# Patient Record
Sex: Female | Born: 1975 | Race: Black or African American | Hispanic: No | Marital: Single | Smoking: Never smoker
Health system: Southern US, Community
[De-identification: ages and names within clinical notes are randomized; demographics above are authoritative.]

## PROBLEM LIST (undated history)

## (undated) DIAGNOSIS — D509 Iron deficiency anemia, unspecified: Secondary | ICD-10-CM

## (undated) DIAGNOSIS — Z9889 Other specified postprocedural states: Secondary | ICD-10-CM

## (undated) DIAGNOSIS — R112 Nausea with vomiting, unspecified: Secondary | ICD-10-CM

## (undated) DIAGNOSIS — I10 Essential (primary) hypertension: Secondary | ICD-10-CM

## (undated) DIAGNOSIS — K429 Umbilical hernia without obstruction or gangrene: Secondary | ICD-10-CM

## (undated) HISTORY — PX: PARTIAL HYSTERECTOMY: SHX80

## (undated) HISTORY — PX: MULTIPLE TOOTH EXTRACTIONS: SHX2053

---

## 2006-12-02 ENCOUNTER — Emergency Department (HOSPITAL_COMMUNITY): Admission: EM | Admit: 2006-12-02 | Discharge: 2006-12-02 | Payer: Self-pay | Admitting: Emergency Medicine

## 2007-04-20 ENCOUNTER — Emergency Department (HOSPITAL_COMMUNITY): Admission: EM | Admit: 2007-04-20 | Discharge: 2007-04-20 | Payer: Self-pay | Admitting: Emergency Medicine

## 2008-10-18 ENCOUNTER — Emergency Department (HOSPITAL_COMMUNITY): Admission: EM | Admit: 2008-10-18 | Discharge: 2008-10-18 | Payer: Self-pay | Admitting: Emergency Medicine

## 2008-11-04 ENCOUNTER — Encounter: Payer: Self-pay | Admitting: Family Medicine

## 2009-12-30 ENCOUNTER — Emergency Department (HOSPITAL_COMMUNITY): Admission: EM | Admit: 2009-12-30 | Discharge: 2009-12-30 | Payer: Self-pay | Admitting: Emergency Medicine

## 2010-02-14 ENCOUNTER — Emergency Department (HOSPITAL_COMMUNITY)
Admission: EM | Admit: 2010-02-14 | Discharge: 2010-02-14 | Payer: Self-pay | Source: Home / Self Care | Admitting: Emergency Medicine

## 2010-02-21 ENCOUNTER — Ambulatory Visit
Admission: RE | Admit: 2010-02-21 | Discharge: 2010-02-21 | Payer: Self-pay | Source: Home / Self Care | Attending: Family Medicine | Admitting: Family Medicine

## 2010-02-21 ENCOUNTER — Other Ambulatory Visit: Payer: Self-pay | Admitting: Family Medicine

## 2010-02-21 ENCOUNTER — Encounter (INDEPENDENT_AMBULATORY_CARE_PROVIDER_SITE_OTHER): Payer: Self-pay | Admitting: *Deleted

## 2010-02-21 DIAGNOSIS — R5381 Other malaise: Secondary | ICD-10-CM | POA: Insufficient documentation

## 2010-02-21 DIAGNOSIS — L509 Urticaria, unspecified: Secondary | ICD-10-CM | POA: Insufficient documentation

## 2010-02-21 DIAGNOSIS — R5383 Other fatigue: Secondary | ICD-10-CM

## 2010-02-21 LAB — CBC WITH DIFFERENTIAL/PLATELET
Basophils Absolute: 0 10*3/uL (ref 0.0–0.1)
Basophils Relative: 0.6 % (ref 0.0–3.0)
Eosinophils Absolute: 0 10*3/uL (ref 0.0–0.7)
Eosinophils Relative: 0.6 % (ref 0.0–5.0)
HCT: 29.6 % — ABNORMAL LOW (ref 36.0–46.0)
Hemoglobin: 9.6 g/dL — ABNORMAL LOW (ref 12.0–15.0)
Lymphocytes Relative: 41.5 % (ref 12.0–46.0)
Lymphs Abs: 3.1 10*3/uL (ref 0.7–4.0)
MCHC: 32.6 g/dL (ref 30.0–36.0)
MCV: 71.5 fl — ABNORMAL LOW (ref 78.0–100.0)
Monocytes Absolute: 0.7 10*3/uL (ref 0.1–1.0)
Monocytes Relative: 8.7 % (ref 3.0–12.0)
Neutro Abs: 3.7 10*3/uL (ref 1.4–7.7)
Neutrophils Relative %: 48.6 % (ref 43.0–77.0)
Platelets: 365 10*3/uL (ref 150.0–400.0)
RBC: 4.14 Mil/uL (ref 3.87–5.11)
RDW: 18 % — ABNORMAL HIGH (ref 11.5–14.6)
WBC: 7.5 10*3/uL (ref 4.5–10.5)

## 2010-02-21 LAB — B12 AND FOLATE PANEL
Folate: 10 ng/mL
Vitamin B-12: 294 pg/mL (ref 211–911)

## 2010-02-21 LAB — BASIC METABOLIC PANEL
BUN: 13 mg/dL (ref 6–23)
CO2: 27 mEq/L (ref 19–32)
Calcium: 9.1 mg/dL (ref 8.4–10.5)
Chloride: 102 mEq/L (ref 96–112)
Creatinine, Ser: 0.6 mg/dL (ref 0.4–1.2)
GFR: 148.36 mL/min (ref >60.00)
Glucose, Bld: 79 mg/dL (ref 70–99)
Potassium: 4.4 mEq/L (ref 3.5–5.1)
Sodium: 135 mEq/L (ref 135–145)

## 2010-02-21 LAB — TSH: TSH: 0.75 u[IU]/mL (ref 0.35–5.50)

## 2010-02-21 LAB — IBC PANEL
Iron: 19 ug/dL — ABNORMAL LOW (ref 42–145)
Saturation Ratios: 3.4 % — ABNORMAL LOW (ref 20.0–50.0)
Transferrin: 396.9 mg/dL — ABNORMAL HIGH (ref 212.0–360.0)

## 2010-03-19 ENCOUNTER — Other Ambulatory Visit (HOSPITAL_COMMUNITY)
Admission: RE | Admit: 2010-03-19 | Discharge: 2010-03-19 | Disposition: A | Discharge status: Home / Self Care | Payer: Managed Care, Other (non HMO) | Source: Ambulatory Visit | Attending: Family Medicine | Admitting: Family Medicine

## 2010-03-19 ENCOUNTER — Other Ambulatory Visit: Payer: Self-pay | Admitting: Family Medicine

## 2010-03-19 ENCOUNTER — Ambulatory Visit
Admission: RE | Admit: 2010-03-19 | Discharge: 2010-03-19 | Payer: Self-pay | Source: Home / Self Care | Attending: Family Medicine | Admitting: Family Medicine

## 2010-03-19 ENCOUNTER — Inpatient Hospital Stay (HOSPITAL_COMMUNITY): Admit: 2010-03-19 | Payer: Self-pay

## 2010-03-19 ENCOUNTER — Encounter: Payer: Self-pay | Admitting: Family Medicine

## 2010-03-19 ENCOUNTER — Other Ambulatory Visit (HOSPITAL_COMMUNITY)
Admission: RE | Admit: 2010-03-19 | Payer: Managed Care, Other (non HMO) | Source: Ambulatory Visit | Admitting: Family Medicine

## 2010-03-19 DIAGNOSIS — A6000 Herpesviral infection of urogenital system, unspecified: Secondary | ICD-10-CM | POA: Insufficient documentation

## 2010-03-19 DIAGNOSIS — Z1159 Encounter for screening for other viral diseases: Secondary | ICD-10-CM | POA: Insufficient documentation

## 2010-03-19 DIAGNOSIS — D509 Iron deficiency anemia, unspecified: Secondary | ICD-10-CM | POA: Insufficient documentation

## 2010-03-19 DIAGNOSIS — Z113 Encounter for screening for infections with a predominantly sexual mode of transmission: Secondary | ICD-10-CM | POA: Insufficient documentation

## 2010-03-19 DIAGNOSIS — R87810 Cervical high risk human papillomavirus (HPV) DNA test positive: Secondary | ICD-10-CM | POA: Insufficient documentation

## 2010-03-19 LAB — LIPID PANEL
Cholesterol: 128 mg/dL (ref 0–200)
HDL: 37.4 mg/dL — ABNORMAL LOW (ref >39.00)
LDL Cholesterol: 81 mg/dL (ref 0–99)
Total CHOL/HDL Ratio: 3
Triglycerides: 50 mg/dL (ref 0.0–149.0)
VLDL: 10 mg/dL (ref 0.0–40.0)

## 2010-03-19 LAB — CBC WITH DIFFERENTIAL/PLATELET
Basophils Absolute: 0 10*3/uL (ref 0.0–0.1)
Basophils Relative: 0.5 % (ref 0.0–3.0)
Eosinophils Absolute: 0 10*3/uL (ref 0.0–0.7)
Eosinophils Relative: 0.5 % (ref 0.0–5.0)
HCT: 33 % — ABNORMAL LOW (ref 36.0–46.0)
Hemoglobin: 11.1 g/dL — ABNORMAL LOW (ref 12.0–15.0)
Lymphocytes Relative: 37.5 % (ref 12.0–46.0)
Lymphs Abs: 1.8 10*3/uL (ref 0.7–4.0)
MCHC: 33.8 g/dL (ref 30.0–36.0)
MCV: 76 fl — ABNORMAL LOW (ref 78.0–100.0)
Monocytes Absolute: 0.5 10*3/uL (ref 0.1–1.0)
Monocytes Relative: 10.5 % (ref 3.0–12.0)
Neutro Abs: 2.5 10*3/uL (ref 1.4–7.7)
Neutrophils Relative %: 51 % (ref 43.0–77.0)
Platelets: 306 10*3/uL (ref 150.0–400.0)
RBC: 4.34 Mil/uL (ref 3.87–5.11)
RDW: 24.5 % — ABNORMAL HIGH (ref 11.5–14.6)
WBC: 4.9 10*3/uL (ref 4.5–10.5)

## 2010-03-19 LAB — BASIC METABOLIC PANEL
BUN: 10 mg/dL (ref 6–23)
CO2: 27 mEq/L (ref 19–32)
Chloride: 101 mEq/L (ref 96–112)
Creatinine, Ser: 0.7 mg/dL (ref 0.4–1.2)
Potassium: 4.1 mEq/L (ref 3.5–5.1)

## 2010-03-20 LAB — CONVERTED CEMR LAB: HIV: NONREACTIVE

## 2010-03-22 NOTE — Letter (Signed)
Summary: Out of Work  [facility] [employer] [facility] at [facility]  [address] [place], [place] [address]   Phone: [phone]  Fax: [phone]    February 21, 2010   Employee:  Rebecca Castillo    To Whom It May Concern:   For Medical reasons, please excuse the above named employee from work for the following dates:  Start:  February 21, 2010 11:29 AM   End:   May Return to work after appt on February 21, 2010 11:29 AM   If you need additional information, please feel free to contact our office.         Sincerely,    Ruthe Mannan MD

## 2010-03-22 NOTE — Assessment & Plan Note (Signed)
Summary: NEW PT TO ESTABH/DLO   Vital Signs:  Patient profile:   35 year old female Height:      67.50 inches Weight:      145.50 pounds BMI:     22.53 Temp:     98.5 degrees F oral Pulse rate:   72 / minute Pulse rhythm:   regular BP sitting:   120 / 80  (right arm) Cuff size:   regular  Vitals Entered By: Linde Gillis CMA Duncan Dull) (February 21, 2010 10:39 AM) CC: new patient, establish care   History of Present Illness: 35 yo here to establish care.  Fatigue- has been very tired lately.  Sometimes can feel her heart race when she stands up.  No SOB or DOE.  No CP.  Has lost a few pounds.  No changes in her bowel habits.  No cold or heat intolerance.  Denies heavy periodes.  Denies any signs or symptoms of depression.   Was once told she was anemic, iron tablets made her nauseated so she stopped taking them.  Urticaria- per pt saw a dermatologist.  If she takes hydroxyzine or benadryl, relieves her symptoms.  Can occur anywhere on her body.  No changes in foods, detergents or anything else she can think of in her environment.  Started in 06/2008. Works in a [employer] around dust and other allergens but has worked there for over 3 years.  Preventive Screening-Counseling & Management  Alcohol-Tobacco     Smoking Status: never      Drug Use:  no.    Current Medications (verified): 1)  Hydroxyzine Hcl 10 Mg Tabs (Hydroxyzine Hcl) .... 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 Eyes:  Denies blurring. ENT:  Denies difficulty swallowing. CV:  Complains of palpitations; denies chest pain or discomfort. Resp:  Denies shortness of breath. GI:  Denies abdominal pain, bloody stools, and change in bowel habits. GU:  Denies discharge, dysuria, and incontinence. MS:  Denies joint pain, joint redness, and joint swelling. Derm:  Denies rash. Neuro:  Denies headaches, visual disturbances, and weakness. Psych:  Denies anxiety and depression. Endo:  Complains of weight change; denies cold intolerance, excessive thirst, excessive urination, and heat intolerance. Heme:  Complains of pallor; denies abnormal bruising and bleeding.  Physical Exam  General:  alert and pale.   Head:  normocephalic and atraumatic.   Eyes:  vision grossly intact, pupils equal, pupils round, and pupils reactive to light.   Ears:  R ear normal and L ear normal.   Nose:  no external deformity.   Mouth:  good dentition.   Neck:  No deformities, masses, or tenderness noted. Lungs:  Normal respiratory effort, chest expands symmetrically. Lungs are clear to auscultation, no  crackles or wheezes. Heart:  Normal rate and regular rhythm. S1 and S2 normal without gallop, murmur, click, rub or other extra sounds. Abdomen:  Bowel sounds positive,abdomen soft and non-tender without masses, organomegaly or hernias noted. Msk:  No deformity or scoliosis noted of thoracic or lumbar spine.   Extremities:  No clubbing, cyanosis, edema, or deformity noted with normal full range of motion of all joints.   Neurologic:  alert & oriented X3 and gait normal.   Skin:  Intact without  suspicious lesions or rashes Cervical Nodes:  No lymphadenopathy noted Psych:  Cognition and judgment appear intact. Alert and cooperative with normal attention span and concentration. No apparent delusions, illusions, hallucinations   Impression & Recommendations:  Problem # 1:  FATIGUE (ICD-780.79) Assessment New Differential is wide but appears pale. Will check CBC, TIBC, b12/folate, TSH, BMET. Orders: Venipuncture (04540) TLB-B12 + Folate Pnl (98119_14782-N56/OZH) TLB-IBC Pnl (Iron/FE;Transferrin) (83550-IBC) TLB-CBC Platelet - w/Differential (85025-CBCD) TLB-TSH (Thyroid Stimulating Hormone) (84443-TSH) TLB-BMP (Basic Metabolic Panel-BMET) (80048-METABOL)  Problem # 2:  UNSPECIFIED URTICARIA (ICD-708.9) Assessment: Unchanged awaiting records from [place] at [facility].  Complete Medication List: 1)  Hydroxyzine Hcl 10 Mg Tabs (Hydroxyzine hcl) .... Take one tablet by mouth every three days  Patient Instructions: 1)  Wonderful to meet you, Joni Reining.   Orders Added: 1)  Venipuncture [36415] 2)  TLB-B12 + Folate Pnl [[id number]-B12/FOL] 3)  TLB-IBC Pnl (Iron/FE;Transferrin) [83550-IBC] 4)  TLB-CBC Platelet - w/Differential [85025-CBCD] 5)  TLB-TSH (Thyroid Stimulating Hormone) [84443-TSH] 6)  TLB-BMP (Basic Metabolic Panel-BMET) [80048-METABOL] 7)  New Patient Level III [08657]    Current Allergies (reviewed today): No known allergies

## 2010-03-22 NOTE — Letter (Signed)
Summary: Rebecca Castillo at [facility]   Imported By: Maryln Gottron 03/13/2010 09:43:29  _____________________________________________________________________  External Attachment:    Type:   Image     Comment:   External Document

## 2010-03-28 NOTE — Assessment & Plan Note (Signed)
Summary: CPX PLUS PAP SMEAR / LFW   Vital Signs:  Patient profile:   35 year old female Height:      67.50 inches Weight:      144.50 pounds BMI:     22.38 Temp:     97.8 degrees F oral Pulse rate:   72 / minute Pulse rhythm:   regular BP sitting:   120 / 82  (left arm) Cuff size:   regular  Vitals Entered By: Linde Gillis CMA (AAMA) (March 19, 2010 8:16 AM) CC: complete physicial with pap   History of Present Illness: 35 yo here for CPX/pap.  Iron deficiency anemia- labs on 1/4 consistent with iron deficiency anemia- H/H 9.6/29.6, MCV 71.5, iron 19.  Started oral iron 325 mg two times a day and already feels much better.  Much less fatigued.  Does not go straight to bed when she comes home from work.    Well woman- G4P4, has not had an abnormal pap smear in over 6 years. 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 G4P4 s/p BTL.  Current Medications (verified): 1)  Hydroxyzine Hcl 10 Mg Tabs (Hydroxyzine Hcl) .... Take One Tablet By Mouth Every Three Days 2)  Ferrous Fumarate 325 Mg Tabs (Ferrous Fumarate) .Marland Kitchen.. 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 Eyes:  Denies blurring. ENT:  Denies difficulty swallowing. CV:  Denies chest pain or discomfort. Resp:  Denies shortness of breath. GI:  Denies abdominal pain and constipation. GU:  Denies abnormal vaginal bleeding, discharge, and dysuria. MS:  Denies joint  pain, joint redness, and joint swelling. Derm:  Complains of rash. Neuro:  Denies headaches. Psych:  Denies anxiety and depression. Endo:  Denies cold intolerance and heat intolerance. Heme:  Denies skin discoloration. Allergy:  Complains of hives or rash.  Physical Exam  General:  alert and a little less pale today  Head:  normocephalic and atraumatic.   Eyes:  vision grossly intact, pupils equal, pupils round, and pupils reactive to light.   Ears:  R ear normal and L ear normal.   Nose:  no external deformity.   Mouth:  good dentition.   Neck:  No deformities, masses, or tenderness noted. Breasts:  No mass, nodules, thickening, tenderness, bulging, retraction, inflamation, nipple discharge or skin changes noted.   Lungs:  Normal respiratory effort, chest expands symmetrically. Lungs are clear to auscultation, no crackles or wheezes. Heart:  Normal rate and regular rhythm. S1 and S2 normal without gallop, murmur, click, rub or other extra sounds. Abdomen:  Bowel sounds positive,abdomen soft and non-tender without masses, organomegaly or hernias noted. Rectal:  no external abnormalities.   Genitalia:  Pelvic Exam:        External: normal female genitalia without lesions or masses        Vagina: normal without lesions or masses        Cervix: normal without lesions or masses        Adnexa: normal bimanual exam without masses or  fullness        Uterus: normal by palpation        Pap smear: performed Msk:  No deformity or scoliosis noted of thoracic or lumbar spine.   Extremities:  no edema Neurologic:  No cranial nerve deficits noted. Station and gait are normal. Plantar reflexes are down-going bilaterally. DTRs are symmetrical throughout. Sensory, motor and coordinative functions appear intact. Skin:  Intact without suspicious lesions or rashes Axillary Nodes:  No palpable lymphadenopathy Inguinal Nodes:  No significant adenopathy Psych:  Cognition and judgment appear intact. Alert  and cooperative with normal attention span and concentration. No apparent delusions, illusions, hallucinations   Impression & Recommendations:  Problem # 1:  HEALTH MAINTENANCE EXAM (ICD-V70.0) Reviewed preventive care protocols, scheduled due services, and updated immunizations Discussed nutrition, exercise, diet, and healthy lifestyle.  Pap today, BMET, FLP. Orders: TLB-BMP (Basic Metabolic Panel-BMET) (80048-METABOL)  Problem # 2:  SCREENING EXAMINATION FOR VENEREAL DISEASE (ICD-V74.5) HIV, RPR, GC chlamydia today. Orders: T-HIV Antibody  (Reflex) [id number]) T-RPR (Syphilis) (82956-21308) Specimen Handling (65784)  Complete Medication List: 1)  Hydroxyzine Hcl 10 Mg Tabs (Hydroxyzine hcl) .... Take one tablet by mouth every three days 2)  Ferrous Fumarate 325 Mg Tabs (Ferrous fumarate) .Marland Kitchen.. 1 by mouth two times a day  Other Orders: Venipuncture (69629) TLB-Lipid Panel (80061-LIPID) TLB-CBC Platelet - w/Differential (85025-CBCD)   Orders Added: 1)  Venipuncture [36415] 2)  TLB-Lipid Panel [80061-LIPID] 3)  TLB-CBC Platelet - w/Differential [85025-CBCD] 4)  TLB-BMP (Basic Metabolic Panel-BMET) [80048-METABOL] 5)  T-HIV Antibody  (Reflex) [[id number]] 6)  T-RPR (Syphilis) [10272-53664] 7)  Specimen Handling [99000] 8)  Est. Patient 18-39 years [99395]    Current Allergies (reviewed today): No known allergies

## 2010-03-28 NOTE — Letter (Signed)
Summary: Out of Work  [facility] [employer] [facility] at [facility]  [address] [place], [place] [address]   Phone: [phone]  Fax: [phone]    March 19, 2010   Employee:  Rebecca Castillo    To Whom It May Concern:   For Medical reasons, please excuse the above named employee from work for the following dates:  Start:   Patient was seen in our office today Jan. 30, 2012  End:   May return to work today.  If you need additional information, please feel free to contact our office.         Sincerely,       Dr. Ruthe Mannan

## 2010-05-26 LAB — POCT I-STAT, CHEM 8
BUN: 13 mg/dL (ref 6–23)
Calcium, Ion: 1.1 mmol/L — ABNORMAL LOW (ref 1.12–1.32)
Chloride: 105 mEq/L (ref 96–112)
Glucose, Bld: 80 mg/dL (ref 70–99)
HCT: 32 % — ABNORMAL LOW (ref 36.0–46.0)
Potassium: 3.8 mEq/L (ref 3.5–5.1)

## 2010-07-21 ENCOUNTER — Emergency Department (HOSPITAL_COMMUNITY)
Admission: EM | Admit: 2010-07-21 | Discharge: 2010-07-22 | Disposition: A | Discharge status: Home / Self Care | Payer: Managed Care, Other (non HMO) | Attending: Emergency Medicine | Admitting: Emergency Medicine

## 2010-07-21 DIAGNOSIS — R51 Headache: Secondary | ICD-10-CM | POA: Insufficient documentation

## 2010-07-21 DIAGNOSIS — R070 Pain in throat: Secondary | ICD-10-CM | POA: Insufficient documentation

## 2010-07-21 DIAGNOSIS — R509 Fever, unspecified: Secondary | ICD-10-CM | POA: Insufficient documentation

## 2010-07-21 DIAGNOSIS — R112 Nausea with vomiting, unspecified: Secondary | ICD-10-CM | POA: Insufficient documentation

## 2010-07-21 DIAGNOSIS — R630 Anorexia: Secondary | ICD-10-CM | POA: Insufficient documentation

## 2010-07-21 LAB — GLUCOSE, CAPILLARY: Glucose-Capillary: 78 mg/dL (ref 70–99)

## 2010-07-21 LAB — POCT PREGNANCY, URINE: Preg Test, Ur: NEGATIVE

## 2010-08-18 ENCOUNTER — Telehealth: Payer: Self-pay | Admitting: *Deleted

## 2010-08-18 NOTE — Telephone Encounter (Signed)
Record Num: [id number] Operator: Patriciaann Clan Patient Name: Rebecca Castillo Call Date & Time: 08/17/2010 6:39:04PM Patient Phone: [phone] PCP: Ruthe Mannan Patient Gender: Female PCP Fax : [phone] Patient DOB: [birth year] Practice Name: [facility] Reason for Call: LMP 08/03/10. Patient calling. States she has hx of allergies. Requesting refill of Hydroxyzine. Patient states she developed generalized intermittent itching and "little bumps" . Onset 08/17/10. Afebrile. No hives. No swelling of lips, tongue or face. No difficulty breathing or wheezing. No drooling or difficulty swallowing. Denies cough. Triage per Rash Protocol. No emergent sx identified. Care advice given per guidelines. Patient advised to use OTC Antihistamine, tepid bath with Aveeno, dress lightly. Call back parameters reviewed.Advised to call office 08/20/10 for refill request. Patient verbalizes understanding. Protocol(s) Used: Rash Recommended Outcome per Protocol: See Provider within 72 Hours Reason for Outcome: Mild to moderate itching AND not responding to home care Care Advice: ~ Apply cool compresses for 15 to 20 minutes 4 to 6 times a day to relieve discomfort. Cool/tepid showers or baths may help relieve itching. If cool water alone does not relieve itching, try adding 1/2 to 1 cup baking soda or colloidal oatmeal (Aveeno) to bath water. ~ ~ SYMPTOM / CONDITION MANAGEMENT For symptom relief, consider nonprescription antihistamines (such as Allerest, Claritin, Zyrtec, Chlor-Trimetron, Benadryl, etc.) as directed on label or by pharmacist. Drowsiness may result, especially in geriatric patients. Non-sedating antihistamines are available without a prescription. ~ 08/17/2010 6:51:49PM Page 1 of 1 CAN_TriageRpt_V2

## 2010-08-20 ENCOUNTER — Telehealth: Payer: Self-pay | Admitting: *Deleted

## 2010-08-20 NOTE — Telephone Encounter (Signed)
Triage Record Num: [id number] Operator: Patriciaann Clan Patient Name: Rebecca Castillo Call Date & Time: 08/17/2010 6:39:04PM Patient Phone: [phone] PCP: Ruthe Mannan Patient Gender: Female PCP Fax : [phone] Patient DOB: 06-20-1975 Practice Name: [facility] Reason for Call: LMP 08/03/10. Patient calling. States she has hx of allergies. Requesting refill of Hydroxyzine. Patient states she developed generalized intermittent itching and "little bumps" . Onset 08/17/10. Afebrile. No hives. No swelling of lips, tongue or face. No difficulty breathing or wheezing. No drooling or difficulty swallowing. Denies cough. Triage per Rash Protocol. No emergent sx identified. Care advice given per guidelines. Patient advised to use OTC Antihistamine, tepid bath with Aveeno, dress lightly. Call back parameters reviewed.Advised to call office 08/20/10 for refill request. Patient verbalizes understanding. Protocol(s) Used: Rash Recommended Outcome per Protocol: See Provider within 72 Hours Reason for Outcome: Mild to moderate itching AND not responding to home care Care Advice: ~ Apply cool compresses for 15 to 20 minutes 4 to 6 times a day to relieve discomfort. Cool/tepid showers or baths may help relieve itching. If cool water alone does not relieve itching, try adding 1/2 to 1 cup baking soda or colloidal oatmeal (Aveeno) to bath water. ~ ~ SYMPTOM / CONDITION MANAGEMENT For symptom relief, consider nonprescription antihistamines (such as Allerest, Claritin, Zyrtec, Chlor-Trimetron, Benadryl, etc.) as directed on label or by pharmacist. Drowsiness may result, especially in geriatric patients. Non-sedating antihistamines are available without a prescription. ~ 08/17/2010 6:51:49PM Page 1 of 1 CAN_TriageRpt_V2

## 2010-08-20 NOTE — Telephone Encounter (Signed)
Please call to check on her

## 2010-08-20 NOTE — Telephone Encounter (Signed)
Called and left a message on cell phone for patient to return call.

## 2010-08-20 NOTE — Telephone Encounter (Signed)
Left message on cell phone voicemail for patient to return call. 

## 2010-08-21 NOTE — Telephone Encounter (Signed)
Left message on cell phone voicemail x 3 for patient to return call.  Will wait to hear from patient.

## 2010-09-27 ENCOUNTER — Encounter: Payer: Self-pay | Admitting: Family Medicine

## 2010-09-27 LAB — HM PAP SMEAR

## 2010-10-01 ENCOUNTER — Ambulatory Visit: Payer: Managed Care, Other (non HMO) | Admitting: Family Medicine

## 2010-10-05 ENCOUNTER — Ambulatory Visit: Payer: Managed Care, Other (non HMO) | Admitting: Family Medicine

## 2010-10-05 DIAGNOSIS — Z0289 Encounter for other administrative examinations: Secondary | ICD-10-CM

## 2011-06-20 ENCOUNTER — Other Ambulatory Visit: Payer: Self-pay | Admitting: Family Medicine

## 2011-06-20 DIAGNOSIS — Z0000 Encounter for general adult medical examination without abnormal findings: Secondary | ICD-10-CM | POA: Insufficient documentation

## 2011-06-20 DIAGNOSIS — Z136 Encounter for screening for cardiovascular disorders: Secondary | ICD-10-CM

## 2011-06-24 ENCOUNTER — Other Ambulatory Visit: Payer: Managed Care, Other (non HMO)

## 2011-07-01 ENCOUNTER — Other Ambulatory Visit (HOSPITAL_COMMUNITY)
Admission: RE | Admit: 2011-07-01 | Discharge: 2011-07-01 | Disposition: A | Discharge status: Home / Self Care | Payer: Managed Care, Other (non HMO) | Source: Ambulatory Visit | Attending: Family Medicine | Admitting: Family Medicine

## 2011-07-01 ENCOUNTER — Telehealth: Payer: Self-pay | Admitting: Radiology

## 2011-07-01 ENCOUNTER — Ambulatory Visit (INDEPENDENT_AMBULATORY_CARE_PROVIDER_SITE_OTHER): Payer: Managed Care, Other (non HMO) | Admitting: Family Medicine

## 2011-07-01 ENCOUNTER — Encounter: Payer: Self-pay | Admitting: Family Medicine

## 2011-07-01 ENCOUNTER — Encounter: Payer: Self-pay | Admitting: *Deleted

## 2011-07-01 VITALS — BP 110/80 | HR 64 | Temp 97.5°F | Ht 67.5 in | Wt 153.0 lb

## 2011-07-01 DIAGNOSIS — K429 Umbilical hernia without obstruction or gangrene: Secondary | ICD-10-CM | POA: Insufficient documentation

## 2011-07-01 DIAGNOSIS — Z0000 Encounter for general adult medical examination without abnormal findings: Secondary | ICD-10-CM

## 2011-07-01 DIAGNOSIS — Z113 Encounter for screening for infections with a predominantly sexual mode of transmission: Secondary | ICD-10-CM | POA: Insufficient documentation

## 2011-07-01 DIAGNOSIS — Z8249 Family history of ischemic heart disease and other diseases of the circulatory system: Secondary | ICD-10-CM

## 2011-07-01 DIAGNOSIS — Z23 Encounter for immunization: Secondary | ICD-10-CM

## 2011-07-01 DIAGNOSIS — Z136 Encounter for screening for cardiovascular disorders: Secondary | ICD-10-CM

## 2011-07-01 DIAGNOSIS — Z01419 Encounter for gynecological examination (general) (routine) without abnormal findings: Secondary | ICD-10-CM | POA: Insufficient documentation

## 2011-07-01 DIAGNOSIS — D509 Iron deficiency anemia, unspecified: Secondary | ICD-10-CM

## 2011-07-01 DIAGNOSIS — R002 Palpitations: Secondary | ICD-10-CM

## 2011-07-01 HISTORY — DX: Family history of ischemic heart disease and other diseases of the circulatory system: Z82.49

## 2011-07-01 LAB — CBC WITH DIFFERENTIAL/PLATELET
Eosinophils Absolute: 0 10*3/uL (ref 0.0–0.7)
Eosinophils Relative: 0.6 % (ref 0.0–5.0)
HCT: 25.4 % — ABNORMAL LOW (ref 36.0–46.0)
Lymphs Abs: 1.9 10*3/uL (ref 0.7–4.0)
MCHC: 30.2 g/dL (ref 30.0–36.0)
MCV: 63.1 fl — ABNORMAL LOW (ref 78.0–100.0)
Monocytes Absolute: 0.5 10*3/uL (ref 0.1–1.0)
Platelets: 390 10*3/uL (ref 150.0–400.0)
WBC: 5.2 10*3/uL (ref 4.5–10.5)

## 2011-07-01 LAB — COMPREHENSIVE METABOLIC PANEL
BUN: 14 mg/dL (ref 6–23)
CO2: 23 mEq/L (ref 19–32)
Calcium: 8.6 mg/dL (ref 8.4–10.5)
Chloride: 100 mEq/L (ref 96–112)
Creatinine, Ser: 0.8 mg/dL (ref 0.4–1.2)
GFR: 105.14 mL/min (ref >60.00)
Glucose, Bld: 78 mg/dL (ref 70–99)

## 2011-07-01 LAB — LIPID PANEL
Cholesterol: 137 mg/dL (ref 0–200)
Triglycerides: 51 mg/dL (ref 0.0–149.0)

## 2011-07-01 MED ORDER — FERROUS FUMARATE 325 (106 FE) MG PO TABS: 1.0000 | ORAL_TABLET | Freq: Two times a day (BID) | ORAL | Status: DC

## 2011-07-01 NOTE — Telephone Encounter (Signed)
Pt called back this afternoon, says she doesn't have any iron and asks that some be sent in.  Iron refilled from [facility], [facility] [address].

## 2011-07-01 NOTE — Patient Instructions (Addendum)
Good to see you. I am so sorry about the loss of your mother.  Please stop by to see Shirlee Limerick on your way out to set up your appointment with surgery. We will call you with your lab results from today.

## 2011-07-01 NOTE — Telephone Encounter (Signed)
Please call pt to let her know that her hemoglobin is very low- 7.7.  I'm waiting for the rest of her CBC. Is she taking her iron? If not, she needs to restart immediately.  I am going to refer her to hematology for further work up and treatment.

## 2011-07-01 NOTE — Telephone Encounter (Signed)
Advised pt of results, she will restart iron as soon as possible.  Prefers to see hematologist in [place].

## 2011-07-01 NOTE — Progress Notes (Signed)
Subjective:    Patient ID: Rebecca Castillo, female    DOB: 10/25/1975, 36 y.o.   MRN: [id number]  HPI  36 yo here for CPX.  Well woman-  G4P4 s/p BTL. 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  Mom died in 05-14-22 of MI- no known h/o CAD.  [name] is understandably worried that she may be at risk for CAD now too.  She did have some palpitations after her mom died but she attributed that to stress. No CP, SOB or DOE. Feels she is coping ok with her loss but does miss her.  Lab Results  Component Value Date   CHOL 128 04-14-2010   HDL 37.40* 2010/04/14   LDLCALC 81 April 14, 2010   TRIG 50.0 April 14, 2010   CHOLHDL 3 2010-04-14     Iron deficiency anemia-  Lab Results  Component Value Date   WBC 4.9 04-14-2010   HGB 11.1* Apr 14, 2010   HCT 33.0* 04/14/2010   MCV 76.0* 04/14/10   PLT 306.0 2010/04/14     Patient Active Problem List  Diagnoses  . UNSPECIFIED URTICARIA  . FATIGUE  . UNSPECIFIED GENITAL HERPES  . ANEMIA, IRON DEFICIENCY  . Routine general medical examination at a health care facility   Past Medical History  Diagnosis Date  . Urticaria     placed on Hydroxyzine   Past Surgical History  Procedure Date  . Cesarean section     x 4   History  Substance Use Topics  . Smoking status: Never Smoker   . Smokeless tobacco: Not on file  . Alcohol Use: No   Family History  Problem Relation Age of Onset  . Hyperlipidemia      Family History  . Hypertension      Family history  . Stroke      Family History   Allergies not on file Current Outpatient Prescriptions on File Prior to Visit  Medication Sig Dispense Refill  . ferrous fumarate (HEMOCYTE - 106 MG FE) 325 (106 FE) MG TABS Take 1 tablet by mouth 2 (two) times daily.        . hydrOXYzine (ATARAX) 10 MG tablet Take 10 mg by mouth every 3 (three) days.         The PMH, PSH, Social History, Family History, Medications, and allergies have been reviewed in [facility], and have been updated if  relevant.     Review of Systems    See HPI Patient reports no  vision/ hearing changes,anorexia, weight change, fever ,adenopathy, persistant / recurrent hoarseness, swallowing issues, chest pain, edema,persistant / recurrent cough, hemoptysis, dyspnea(rest, exertional, paroxysmal nocturnal), gastrointestinal  bleeding (melena, rectal bleeding), abdominal pain, excessive heart burn, GU symptoms(dysuria, hematuria, pyuria, voiding/incontinence  Issues) syncope, focal weakness, severe memory loss, concerning skin lesions, depression, anxiety, abnormal bruising/bleeding, major joint swelling, breast masses or abnormal vaginal bleeding.    Objective:   Physical Exam BP 110/80  Pulse 64  Temp(Src) 97.5 F (36.4 C) (Oral)  Ht 5' 7.5" (1.715 m)  Wt 153 lb (69.4 kg)  BMI 23.61 kg/m2  LMP 06/23/2011  General:  Well-developed,well-nourished,in no acute distress; alert,appropriate and cooperative throughout examination Head:  normocephalic and atraumatic.   Eyes:  vision grossly intact, pupils equal, pupils round, and pupils reactive to light.   Ears:  R ear normal and L ear normal.   Nose:  no external deformity.   Mouth:  good dentition.   Neck:  No deformities, masses, or tenderness noted. Breasts:  No mass, nodules,  thickening, tenderness, bulging, retraction, inflamation, nipple discharge or skin changes noted.   Lungs:  Normal respiratory effort, chest expands symmetrically. Lungs are clear to auscultation, no crackles or wheezes. Heart:  Normal rate and regular rhythm. S1 and S2 normal without gallop, murmur, click, rub or other extra sounds. Abdomen:  Bowel sounds positive,abdomen soft and non-tender without masses, organomegaly Small umbical hernia, NTTP, reducible Rectal:  no external abnormalities.   Genitalia:  Pelvic Exam:        External: normal female genitalia without lesions or masses        Vagina: normal without lesions or masses        Cervix: normal without lesions or  masses        Adnexa: normal bimanual exam without masses or fullness        Uterus: normal by palpation        Pap smear: performed Msk:  No deformity or scoliosis noted of thoracic or lumbar spine.   Extremities:  No clubbing, cyanosis, edema, or deformity noted with normal full range of motion of all joints.   Neurologic:  alert & oriented X3 and gait normal.   Skin:  Intact without suspicious lesions or rashes Cervical Nodes:  No lymphadenopathy noted Axillary Nodes:  No palpable lymphadenopathy Psych:  Cognition and judgment appear intact. Alert and cooperative with normal attention span and concentration. No apparent delusions, illusions, hallucinations     Assessment & Plan:   1. Routine general medical examination at a health care facility  Reviewed preventive care protocols, scheduled due services, and updated immunizations Discussed nutrition, exercise, diet, and healthy lifestyle.  Comprehensive metabolic panel [[facility]], Cytology - PAP Tdap  2. ANEMIA, IRON DEFICIENCY  CBC  3. Palpitations  Resolved.  Most likely due to anxiety from acute grief reaction. EKG reassuring- asymptomatic sinus brady.   4. Family history of early CAD  Lipid Panel  5. Screening for STD (sexually transmitted disease)  HIV Antibody, RPR, Cytology - PAP

## 2011-07-01 NOTE — Progress Notes (Signed)
Addended by: Eliezer Bottom on: 07/01/2011 10:52 AM   Modules accepted: Orders

## 2011-07-01 NOTE — Telephone Encounter (Signed)
[facility] call critical results, HGB 7.7, HCT 25.4

## 2011-07-02 ENCOUNTER — Telehealth: Payer: Self-pay | Admitting: *Deleted

## 2011-07-02 LAB — RPR

## 2011-07-02 NOTE — Telephone Encounter (Signed)
[facility] saying that hemocyte sent in yesterday is not available and they are asking to change to something else.  Please advise.  Uses [facility] [address], # [phone].

## 2011-07-03 ENCOUNTER — Other Ambulatory Visit: Payer: Self-pay

## 2011-07-03 ENCOUNTER — Telehealth: Payer: Self-pay | Admitting: Oncology

## 2011-07-03 MED ORDER — FERROUS SULFATE 300 (60 FE) MG PO TABS: ORAL_TABLET | ORAL | Status: DC

## 2011-07-03 NOTE — Telephone Encounter (Signed)
Rx sent 

## 2011-07-03 NOTE — Telephone Encounter (Signed)
Pt went to [facility] [address] and was told iron had not been sent in. I explained to pt was sent in this AM. [facility] at [facility] [address] said they could not get Ferrous Sulfate 300 mg. [facility] does have Ferrous Sulfate 325 mg available. Pt said she will go to [facility] and [facility] if Dr Dayton Martes wants the 300 mg.  Pt will wait for call back at [phone].

## 2011-07-03 NOTE — Telephone Encounter (Signed)
lmonvm for pt re calling me for appt w/FS. °

## 2011-07-04 ENCOUNTER — Other Ambulatory Visit: Payer: Self-pay | Admitting: *Deleted

## 2011-07-04 ENCOUNTER — Telehealth: Payer: Self-pay | Admitting: Oncology

## 2011-07-04 MED ORDER — AZITHROMYCIN 500 MG PO TABS: ORAL_TABLET | ORAL | Status: DC

## 2011-07-04 MED ORDER — FLUCONAZOLE 150 MG PO TABS: 150.0000 mg | ORAL_TABLET | Freq: Once | ORAL | Status: AC

## 2011-07-04 MED ORDER — FERROUS SULFATE 325 (65 FE) MG PO TABS: 325.0000 mg | ORAL_TABLET | Freq: Two times a day (BID) | ORAL | Status: DC

## 2011-07-04 NOTE — Telephone Encounter (Signed)
lmonvm for pt re calling me for appt w/FS. 2nd attempt.  °

## 2011-07-04 NOTE — Telephone Encounter (Signed)
Ferrous sulfate 325 mg twice daily is ok to fill.

## 2011-07-04 NOTE — Telephone Encounter (Signed)
Pt advised of pap results, meds called to pharmacy.

## 2011-07-05 ENCOUNTER — Telehealth: Payer: Self-pay | Admitting: Internal Medicine

## 2011-07-05 NOTE — Telephone Encounter (Signed)
s/w pt and she is awre of her new pt appt on 5/22  aom

## 2011-07-08 ENCOUNTER — Encounter: Payer: Self-pay | Admitting: Oncology

## 2011-07-08 ENCOUNTER — Other Ambulatory Visit: Payer: Self-pay | Admitting: Oncology

## 2011-07-08 DIAGNOSIS — D649 Anemia, unspecified: Secondary | ICD-10-CM

## 2011-07-10 ENCOUNTER — Telehealth: Payer: Self-pay | Admitting: Oncology

## 2011-07-10 ENCOUNTER — Other Ambulatory Visit (HOSPITAL_BASED_OUTPATIENT_CLINIC_OR_DEPARTMENT_OTHER): Payer: Managed Care, Other (non HMO) | Admitting: Lab

## 2011-07-10 ENCOUNTER — Ambulatory Visit: Payer: Managed Care, Other (non HMO)

## 2011-07-10 ENCOUNTER — Ambulatory Visit (HOSPITAL_BASED_OUTPATIENT_CLINIC_OR_DEPARTMENT_OTHER): Payer: Managed Care, Other (non HMO) | Admitting: Oncology

## 2011-07-10 ENCOUNTER — Telehealth: Payer: Self-pay | Admitting: *Deleted

## 2011-07-10 VITALS — BP 107/72 | HR 58 | Temp 98.9°F | Ht 67.5 in | Wt 152.6 lb

## 2011-07-10 DIAGNOSIS — Z0000 Encounter for general adult medical examination without abnormal findings: Secondary | ICD-10-CM

## 2011-07-10 DIAGNOSIS — N920 Excessive and frequent menstruation with regular cycle: Secondary | ICD-10-CM

## 2011-07-10 DIAGNOSIS — D509 Iron deficiency anemia, unspecified: Secondary | ICD-10-CM

## 2011-07-10 DIAGNOSIS — R5381 Other malaise: Secondary | ICD-10-CM

## 2011-07-10 DIAGNOSIS — D649 Anemia, unspecified: Secondary | ICD-10-CM

## 2011-07-10 DIAGNOSIS — D500 Iron deficiency anemia secondary to blood loss (chronic): Secondary | ICD-10-CM

## 2011-07-10 LAB — CBC WITH DIFFERENTIAL/PLATELET
Basophils Absolute: 0 10*3/uL (ref 0.0–0.1)
Eosinophils Absolute: 0.1 10*3/uL (ref 0.0–0.5)
HGB: 7.5 g/dL — ABNORMAL LOW (ref 11.6–15.9)
LYMPH%: 43.5 % (ref 14.0–49.7)
MONO#: 0.5 10*3/uL (ref 0.1–0.9)
NEUT#: 2.2 10*3/uL (ref 1.5–6.5)
Platelets: 385 10*3/uL (ref 145–400)
RBC: 4.05 10*6/uL (ref 3.70–5.45)
WBC: 4.9 10*3/uL (ref 3.9–10.3)
nRBC: 0 % (ref 0–0)

## 2011-07-10 LAB — COMPREHENSIVE METABOLIC PANEL
ALT: 12 U/L (ref 0–35)
CO2: 24 mEq/L (ref 19–32)
Calcium: 8.9 mg/dL (ref 8.4–10.5)
Chloride: 105 mEq/L (ref 96–112)
Creatinine, Ser: 0.78 mg/dL (ref 0.50–1.10)
Sodium: 136 mEq/L (ref 135–145)
Total Protein: 6.9 g/dL (ref 6.0–8.3)

## 2011-07-10 LAB — CHCC SMEAR

## 2011-07-10 NOTE — Telephone Encounter (Signed)
appts made and printed for pt,mw to see if iron avail and pt aware that i will call her   aom

## 2011-07-10 NOTE — Progress Notes (Signed)
CC:   Ruthe Mannan, M.D.  REFERRING PHYSICIAN:  Ruthe Mannan, M.D.  REASON FOR CONSULTATION:  Anemia.  HISTORY OF PRESENT ILLNESS:  This is a pleasant 36 year old woman, native of [place], grew up in [place] and currently lives in [place] since 2008.  She is a relatively healthy woman without really any significant comorbid condition and had been diagnosed with iron deficiency anemia multiple times in the past.  However she had not been taking iron replacement regularly.  She was prescribed a oral iron back in January 2012, but the she stopped taking it, and most recently had a physical exam and repeat laboratory testing on May 13 that showed her hemoglobin has drifted from 11.1 in January 2012 down to 7.7.  Her MCV was significantly lower going down from 76 to 63, her platelets were 319 and her RDW is elevated at 19.3.  At that time, she resumed taking oral iron, reportedly in the only in the last 2 days.  She has admittedly been noncompliant in the past.  She has cited a lot of problems with taking iron including forgetting doses as well was a lot of stipulation associated with certain diets.  She has not had any constipation, had not had any dyspepsia in last 2 days.  She does report fatigue as well as decrease in her exercise tolerance, but for the most part, had been doing relatively well.  She has continued to work full time, attend to her children's needs.  Had not reported any other blood loss.  She does report very erratic menstrual cycles that are heavy at times, but no GI bleeding.  No epistaxis.  No genitourinary bleeding.  REVIEW OF SYSTEMS:  Did not report any headaches, blurry vision, double vision. Did not report  any motor or sensory neuropathy. Did not report any alteration in status. Did not report  any psychiatric issues or depression. Did not report any fever, chills, sweats. Did not report any cough, hemoptysis, or hematemesis.  No nausea or vomiting.  No  abdominal pain, hematochezia, melena, or genitourinary complaints.  She does report ice craving.  Rest review of systems is unremarkable.  PAST MEDICAL HISTORY:  Significant for iron-deficiency anemia, history of urticaria.  She is status post 4 cesarean sections.  She also history of genital herpes.  MEDICATIONS:  She is on iron sulfate tablet twice a day.  She takes also Bayer aspirin as needed for pain.  ALLERGIES:  None.  SOCIAL HISTORY:  She is single.  She has, as mentioned, 4 children.  She denied any alcohol or tobacco abuse.  FAMILY HISTORY:  Mother died of coronary artery disease.  She had a stroke.  Father was killed a long time ago.  No other family history of any hemoglobinopathy or any other malignancies.  PHYSICAL EXAMINATION:  Alert, awake, female appeared in no active distress.  Blood pressure today is 107/72, pulse 58, respirations 20, temperature is 98.9.  Head:  Normocephalic, atraumatic.  Pupils equal, round, reactive to light.  Oral mucosa moist and pink.  Neck:  Supple without adenopathy.  Heart:  Regular rate and rhythm.  S1, S2.  Lungs: Clear to auscultation, no rhonchi or wheeze, no dullness to percussion. Abdomen:  Soft, nontender.  No hepatosplenomegaly.  Extremities:  No clubbing, cyanosis, or edema.  Neurological:  Intact motor, sensory and deep tendon reflexes.  LABORATORY DATA:  Hemoglobin of 7.5, white cell count 4.9, blood count of 685, MCV 61, RDW of 18.  Peripheral smear showed hypochromic microcytic  red cells. No evidence of any schistocytosis, red cell fragmentation.  ASSESSMENT AND PLAN:  A 36 year old woman with the following issues: 1. Hypochromic microcytic anemia, undoubtedly related to her iron     deficiency, which is, I think, most likely related to chronic     losses due to menstrual bleeding.  She does not report any GI     symptoms to suggest GI blood losses.  She has been erratic in her     p.o. iron replacement so I think most  likely the etiology is iron     deficiency.  Other etiologies includes including hemoglobinopathy     are certainly possible again and I think it is less likely at this     time.  In terms of replacing her iron, I have given her the option     of continued p.o. iron at this time.  Also talked to her in detail     about the risks and benefits of IV iron and different formulations     that we have. I have talked about IV dextran,  iron sucrose as well     as Feraheme as different formulations and after discussed of the     risks and benefits of the church and she is interested in Feraheme     infusion.  I have talked to her bout the length of infusion     associated with that, toxicities including infusion related     toxicity, arthralgias, myalgias.  I also feel that is probably the     quickest way to get her iron stores repleted.  She is agreeable to     proceed.  We will set that up for her in the near future.  I will     have her follow up in 2 months after that, to recheck her iron     stores.  In the meantime, I have continued to ask her to take p.o.     iron for maintenance purposes. 2. Weakness, fatigue and ice cravings, undoubtedly related to her iron     deficiency.  I anticipate the symptoms will improve in the near     future. 3. Irregular menstrual cycles.  Again I do not really see any evidence     of any heavy menstrual bleeding at this time.  However, I     encouraged her to follow up with her primary care or Ob/Gyn if she     has any problems with that.    ______________________________ Benjiman Core, M.D. FNS/MEDQ  D:  07/10/2011  T:  07/10/2011  Job:  [id number]

## 2011-07-10 NOTE — Telephone Encounter (Signed)
s/w pt,pt aware of appts and will come by 5/28 to get contrast     aom

## 2011-07-10 NOTE — Progress Notes (Signed)
Note dictated

## 2011-07-10 NOTE — Telephone Encounter (Signed)
Referred by Dr. Dayton Martes Dx- IDA

## 2011-07-10 NOTE — Telephone Encounter (Signed)
Per staff message from Anne, I have scheduled treatment appts. JMW  

## 2011-07-11 ENCOUNTER — Encounter: Payer: Self-pay | Admitting: Oncology

## 2011-07-11 ENCOUNTER — Telehealth: Payer: Self-pay | Admitting: Oncology

## 2011-07-11 NOTE — Telephone Encounter (Signed)
Iron added. lmonvm for pt confirming appts for 5/24 and 7/22.

## 2011-07-11 NOTE — Progress Notes (Signed)
New patient, patient has insurance, patient was not in need of financial assistance at this time did give patient my contact information, . Patient felt like insurance would cover what was needed. Patient will contact advocate if anything changes. 

## 2011-07-12 ENCOUNTER — Ambulatory Visit (HOSPITAL_BASED_OUTPATIENT_CLINIC_OR_DEPARTMENT_OTHER): Payer: Managed Care, Other (non HMO)

## 2011-07-12 VITALS — BP 114/72 | HR 62 | Temp 98.6°F

## 2011-07-12 DIAGNOSIS — Z8249 Family history of ischemic heart disease and other diseases of the circulatory system: Secondary | ICD-10-CM

## 2011-07-12 DIAGNOSIS — R5381 Other malaise: Secondary | ICD-10-CM

## 2011-07-12 DIAGNOSIS — A6000 Herpesviral infection of urogenital system, unspecified: Secondary | ICD-10-CM

## 2011-07-12 DIAGNOSIS — D509 Iron deficiency anemia, unspecified: Secondary | ICD-10-CM

## 2011-07-12 DIAGNOSIS — D649 Anemia, unspecified: Secondary | ICD-10-CM

## 2011-07-12 DIAGNOSIS — K429 Umbilical hernia without obstruction or gangrene: Secondary | ICD-10-CM

## 2011-07-12 DIAGNOSIS — Z0000 Encounter for general adult medical examination without abnormal findings: Secondary | ICD-10-CM

## 2011-07-12 DIAGNOSIS — R002 Palpitations: Secondary | ICD-10-CM

## 2011-07-12 DIAGNOSIS — L509 Urticaria, unspecified: Secondary | ICD-10-CM

## 2011-07-12 MED ORDER — SODIUM CHLORIDE 0.9 % IV SOLN
Freq: Once | INTRAVENOUS | Status: AC
  Administered 2011-07-12: 14:00:00 via INTRAVENOUS

## 2011-07-12 MED ORDER — SODIUM CHLORIDE 0.9 % IV SOLN
1020.0000 mg | Freq: Once | INTRAVENOUS | Status: AC
  Administered 2011-07-12: 1020 mg via INTRAVENOUS
  Filled 2011-07-12: qty 34

## 2011-07-12 NOTE — Patient Instructions (Signed)
Iron Dextran injection What is this medicine? Iron is used to make healthy red blood cells, which carry oxygen and nutrients through the body. This medicine is used to treat people who cannot take iron by mouth and have low levels of iron in the blood. This medicine may be used for other purposes; ask your health care provider or pharmacist if you have questions. What should I tell my health care provider before I take this medicine? They need to know if you have any of these conditions: -anemia not caused by low iron levels -heart disease -high levels of iron in the blood -kidney disease -liver disease -an unusual or allergic reaction to iron, other medicines, foods, dyes, or preservatives -pregnant or trying to get pregnant -breast-feeding How should I use this medicine? This medicine is for injection into a vein or a muscle. It is given by a health care professional in a hospital or clinic setting. Talk to your pediatrician regarding the use of this medicine in children. While this drug may be prescribed for children as young as 34 months old for selected conditions, precautions do apply. Overdosage: If you think you have taken too much of this medicine contact a poison control center or emergency room at once. NOTE: This medicine is only for you. Do not share this medicine with others. What if I miss a dose? It is important not to miss your dose. Call your doctor or health care professional if you are unable to keep an appointment. What may interact with this medicine? This medicine may also interact with the following medications: -chloramphenicol -deferasirox This list may not describe all possible interactions. Give your health care provider a list of all the medicines, herbs, non-prescription drugs, or dietary supplements you use. Also tell them if you smoke, drink alcohol, or use illegal drugs. Some items may interact with your medicine. What should I watch for while using this  medicine? Visit your doctor or health care professional regularly. Tell your doctor if your symptoms do not start to get better or if they get worse. You may need blood work done while you are taking this medicine. You may need to follow a special diet. Talk to your doctor. Foods that contain iron include: whole grains/cereals, dried fruits, beans, or peas, leafy green vegetables, and organ meats (liver, kidney). Long-term use of this medicine may increase your risk of some cancers. Talk to your doctor about how to limit your risk. What side effects may I notice from receiving this medicine? Side effects that you should report to your doctor or health care professional as soon as possible: -allergic reactions like skin rash, itching or hives, swelling of the face, lips, or tongue -blue lips, nails, or skin -breathing problems -changes in blood pressure -chest pain -confusion -fast, irregular heartbeat -feeling faint or lightheaded, falls -fever or chills -flushing, sweating, or hot feelings -joint or muscle aches or pains -pain, tingling, numbness in the hands or feet -seizures -unusually weak or tired Side effects that usually do not require medical attention (report to your doctor or health care professional if they continue or are bothersome): -change in taste (metallic taste) -diarrhea -headache -irritation at site where injected -nausea, vomiting -stomach upset This list may not describe all possible side effects. Call your doctor for medical advice about side effects. You may report side effects to FDA at 1-800-FDA-1088. Where should I keep my medicine? This drug is given in a hospital or clinic and will not be stored at home.  NOTE: This sheet is a summary. It may not cover all possible information. If you have questions about this medicine, talk to your doctor, pharmacist, or health care provider.  2012, Elsevier/[employer] Standard. (06/23/2007 4:59:50 PM)

## 2011-07-16 ENCOUNTER — Ambulatory Visit: Payer: Managed Care, Other (non HMO) | Admitting: Internal Medicine

## 2011-07-16 ENCOUNTER — Other Ambulatory Visit: Payer: Managed Care, Other (non HMO) | Admitting: Lab

## 2011-07-16 ENCOUNTER — Ambulatory Visit: Payer: Managed Care, Other (non HMO)

## 2011-07-29 ENCOUNTER — Emergency Department (HOSPITAL_COMMUNITY)
Admission: EM | Admit: 2011-07-29 | Discharge: 2011-07-29 | Disposition: A | Discharge status: Home / Self Care | Payer: Managed Care, Other (non HMO) | Attending: Emergency Medicine | Admitting: Emergency Medicine

## 2011-07-29 ENCOUNTER — Encounter (HOSPITAL_COMMUNITY): Payer: Self-pay

## 2011-07-29 DIAGNOSIS — R109 Unspecified abdominal pain: Secondary | ICD-10-CM | POA: Insufficient documentation

## 2011-07-29 DIAGNOSIS — R319 Hematuria, unspecified: Secondary | ICD-10-CM | POA: Insufficient documentation

## 2011-07-29 DIAGNOSIS — R509 Fever, unspecified: Secondary | ICD-10-CM | POA: Insufficient documentation

## 2011-07-29 DIAGNOSIS — D649 Anemia, unspecified: Secondary | ICD-10-CM | POA: Insufficient documentation

## 2011-07-29 DIAGNOSIS — N12 Tubulo-interstitial nephritis, not specified as acute or chronic: Secondary | ICD-10-CM

## 2011-07-29 LAB — URINE MICROSCOPIC-ADD ON

## 2011-07-29 LAB — URINALYSIS, ROUTINE W REFLEX MICROSCOPIC
Glucose, UA: NEGATIVE mg/dL
Protein, ur: 100 mg/dL — AB
Specific Gravity, Urine: 1.02 (ref 1.005–1.030)
pH: 6 (ref 5.0–8.0)

## 2011-07-29 MED ORDER — OXYCODONE-ACETAMINOPHEN 5-325 MG PO TABS: 1.0000 | ORAL_TABLET | ORAL | Status: AC | PRN

## 2011-07-29 MED ORDER — CEPHALEXIN 500 MG PO CAPS: 500.0000 mg | ORAL_CAPSULE | Freq: Four times a day (QID) | ORAL | Status: AC

## 2011-07-29 MED ORDER — LIDOCAINE HCL (PF) 1 % IJ SOLN
INTRAMUSCULAR | Status: AC
  Filled 2011-07-29: qty 5

## 2011-07-29 MED ORDER — IBUPROFEN 800 MG PO TABS
800.0000 mg | ORAL_TABLET | Freq: Once | ORAL | Status: DC
  Filled 2011-07-29: qty 1

## 2011-07-29 MED ORDER — CEFTRIAXONE SODIUM 1 G IJ SOLR
1.0000 g | Freq: Once | INTRAMUSCULAR | Status: AC
  Administered 2011-07-29: 1 g via INTRAMUSCULAR
  Filled 2011-07-29: qty 10

## 2011-07-29 NOTE — Discharge Instructions (Signed)
Pyelonephritis, Adult Pyelonephritis is a kidney infection. A kidney infection can happen quickly, or it can last for a long time. HOME CARE   Take your medicine (antibiotics) as told. Finish it even if you start to feel better.   Keep all doctor visits as told.   Drink enough fluids to keep your pee (urine) clear or pale yellow.   Only take medicine as told by your doctor.  GET HELP RIGHT AWAY IF:   You have a fever.   You cannot take your medicine or drink fluids as told.   You have chills and shaking.   You feel very weak or pass out (faint).   You do not feel better after 2 days.  MAKE SURE YOU:  Understand these instructions.   Will watch your condition.   Will get help right away if you are not doing well or get worse.  Document Released: 03/14/2004 Document Revised: 01/24/2011 Document Reviewed: 07/25/2010 Exit[employer] Patient Information 2012 [employer]. 

## 2011-07-29 NOTE — ED Notes (Signed)
Pt. Denies any hematuria, but does have pressure while she voids last week.  Presently that pressure has passed, and pt.s urine is strong

## 2011-07-29 NOTE — ED Provider Notes (Signed)
History    This chart was scribed for Rebecca Gaskins, MD, MD by Smitty Pluck. The patient was seen in room STRE6 and the patient's care was started at 12:38PM.   CSN: [id number]  Arrival date & time 07/29/11  1152   First MD Initiated Contact with Patient 07/29/11 1232      Chief Complaint  Patient presents with  . Flank Pain     Patient is a 36 y.o. female presenting with flank pain. The history is provided by the patient.  Flank Pain Pertinent negatives include no abdominal pain and no shortness of breath.   Pegah Segel is a 36 y.o. female who presents to the Emergency Department complaining of moderate left flank pain onset 2 days ago. Pt reports having fever of 102 this AM. Pt reports having tingling sensation during urination onset 1 week ago. Pt reports that she is anemic. Pt reports that her urine is darker and that she it has a strong odor. Denies chest pain, SOB, diarrhea, vaginal bleeding, numbness, dysuria and abdominal pain. Pain has been constant since onset without radiation.   Past Medical History  Diagnosis Date  . 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 Anemia 07/08/2011  . Anemia 07/08/2011    Past Surgical History  Procedure Date  . Cesarean section     x 4    Family History  Problem Relation Age of Onset  . Hyperlipidemia      Family History  . Hypertension      Family history  . Stroke      Family History  . Heart disease Mother 55    History  Substance Use Topics  . Smoking status: Never Smoker   . Smokeless tobacco: Not on file  . Alcohol Use: No    OB History    Grav Para Term Preterm Abortions TAB SAB Ect Mult Living                  Review of Systems  Constitutional: Positive for fever.  Respiratory: Negative for shortness of breath.   Gastrointestinal: Positive for nausea. Negative for vomiting and abdominal pain.  Genitourinary: Positive for hematuria and flank pain. Negative for dysuria, urgency, vaginal bleeding, vaginal  discharge and difficulty urinating.  Musculoskeletal: Negative for back pain.  All other systems reviewed and are negative.    Allergies  Review of patient's allergies indicates no known allergies.  Home Medications   Current Outpatient Rx  Name Route Sig Dispense Refill  . ASPIRIN 325 MG PO TABS Oral Take 325 mg by mouth as needed. toothache    . BC HEADACHE POWDER PO Oral Take 1 packet by mouth daily as needed. For pain    . FERROUS SULFATE 325 (65 FE) MG PO TABS Oral Take 325 mg by mouth every other day.      BP 109/69  Pulse 99  Temp(Src) 99.4 F (37.4 C) (Oral)  Resp 16  SpO2 100%  LMP 06/23/2011  Physical Exam  Nursing note and vitals reviewed.  CONSTITUTIONAL: Well developed/well nourished HEAD AND FACE: Normocephalic/atraumatic EYES: EOMI/PERRL ENMT: Mucous membranes moist NECK: supple no meningeal signs SPINE:entire spine nontender CV: S1/S2 noted, no murmurs/rubs/gallops noted LUNGS: Lungs are clear to auscultation bilaterally, no apparent distress ABDOMEN: soft, nontender, no rebound or guarding GU: left flank tenderness  NEURO: Pt is awake/alert, moves all extremitiesx4 EXTREMITIES: pulses normal, full ROM SKIN: warm, color normal PSYCH: no abnormalities of mood noted  ED Course  Procedures  DIAGNOSTIC STUDIES: Oxygen Saturation is 100% on room air, normal by my interpretation.    COORDINATION OF CARE: 12:45PM EDP orders medication: ibuprofen 800 mg   Labs Reviewed  URINALYSIS, ROUTINE W REFLEX MICROSCOPIC - Abnormal; Notable for the following:    APPearance TURBID (*)    Hgb urine dipstick MODERATE (*)    Protein, ur 100 (*)    Nitrite POSITIVE (*)    Leukocytes, UA LARGE (*)    All other components within normal limits  URINE MICROSCOPIC-ADD ON - Abnormal; Notable for the following:    Squamous Epithelial / LPF FEW (*)    Bacteria, UA FEW (*)    Casts GRANULAR CAST (*)    All other components within normal limits  POCT PREGNANCY, URINE     Pt well appearing, but likely has pyelo, stable for outpatient management  The patient appears reasonably screened and/or stabilized for discharge and I doubt any other medical condition or other [facility] requiring further screening, evaluation, or treatment in the ED at this time prior to discharge.    MDM  Nursing notes including past medical history, social history and family history reviewed and considered in documentation All labs/vitals reviewed and considered   I personally performed the services described in this documentation, which was scribed in my presence. The recorded information has been reviewed and considered.          Rebecca Gaskins, MD 07/29/11 [phone]

## 2011-07-29 NOTE — ED Notes (Signed)
Lt. Flank pain began 2 days ago pain is increasing and pt. Is having fever and nausea.

## 2011-07-30 ENCOUNTER — Ambulatory Visit (INDEPENDENT_AMBULATORY_CARE_PROVIDER_SITE_OTHER): Payer: Self-pay | Admitting: General Surgery

## 2011-07-31 ENCOUNTER — Encounter (INDEPENDENT_AMBULATORY_CARE_PROVIDER_SITE_OTHER): Payer: Self-pay | Admitting: General Surgery

## 2011-09-09 ENCOUNTER — Ambulatory Visit: Payer: Managed Care, Other (non HMO) | Admitting: Oncology

## 2011-09-09 ENCOUNTER — Other Ambulatory Visit: Payer: Managed Care, Other (non HMO) | Admitting: Lab

## 2011-09-10 ENCOUNTER — Telehealth: Payer: Self-pay | Admitting: Oncology

## 2011-09-10 NOTE — Telephone Encounter (Signed)
Returned pt's call re r/s appt but was not able to reach her.

## 2011-09-18 ENCOUNTER — Encounter: Payer: Self-pay | Admitting: Family Medicine

## 2011-09-18 ENCOUNTER — Telehealth: Payer: Self-pay

## 2011-09-18 ENCOUNTER — Ambulatory Visit (INDEPENDENT_AMBULATORY_CARE_PROVIDER_SITE_OTHER): Payer: Managed Care, Other (non HMO) | Admitting: Family Medicine

## 2011-09-18 VITALS — BP 120/80 | HR 60 | Temp 98.1°F | Wt 143.0 lb

## 2011-09-18 DIAGNOSIS — R634 Abnormal weight loss: Secondary | ICD-10-CM | POA: Insufficient documentation

## 2011-09-18 DIAGNOSIS — D649 Anemia, unspecified: Secondary | ICD-10-CM

## 2011-09-18 DIAGNOSIS — B373 Candidiasis of vulva and vagina: Secondary | ICD-10-CM | POA: Insufficient documentation

## 2011-09-18 DIAGNOSIS — N760 Acute vaginitis: Secondary | ICD-10-CM

## 2011-09-18 LAB — CBC WITH DIFFERENTIAL/PLATELET
Basophils Relative: 0.5 % (ref 0.0–3.0)
Eosinophils Absolute: 0 10*3/uL (ref 0.0–0.7)
Eosinophils Relative: 0.7 % (ref 0.0–5.0)
HCT: 34 % — ABNORMAL LOW (ref 36.0–46.0)
Hemoglobin: 10.9 g/dL — ABNORMAL LOW (ref 12.0–15.0)
MCHC: 32.2 g/dL (ref 30.0–36.0)
MCV: 83.3 fl (ref 78.0–100.0)
Monocytes Absolute: 0.4 10*3/uL (ref 0.1–1.0)
Neutro Abs: 2.9 10*3/uL (ref 1.4–7.7)
RBC: 4.08 Mil/uL (ref 3.87–5.11)

## 2011-09-18 MED ORDER — FLUCONAZOLE 150 MG PO TABS: 150.0000 mg | ORAL_TABLET | Freq: Once | ORAL | Status: AC

## 2011-09-18 MED ORDER — MIRTAZAPINE 15 MG PO TABS: 15.0000 mg | ORAL_TABLET | Freq: Every day | ORAL | Status: DC

## 2011-09-18 NOTE — Patient Instructions (Addendum)
Good to see you. We are starting remeron 15 mg nightly. Please call me in one month with an update. We will call you with your test results from today.

## 2011-09-18 NOTE — Telephone Encounter (Signed)
Pt forgot to get work excuse and requested letter faxed to [facility] [place] [phone].

## 2011-09-18 NOTE — Progress Notes (Signed)
SUBJECTIVE:  36 y.o. female complains of white and copious vaginal discharge for 3 day(s). Denies abnormal vaginal bleeding or significant pelvic pain or fever. No UTI symptoms. H/o chlamydia that was treated.  Partner told pt he was treated.  Unintentional weight loss- under more stress.  Mom died, recently broke up with her boyfriend. She feels her appetite has decreased and continues to loose weight.  She's tearful.  No SI or HI.  Wt Readings from Last 3 Encounters:  09/18/11 143 lb (64.864 kg)  07/10/11 152 lb 9.6 oz (69.219 kg)  07/01/11 153 lb (69.4 kg)     No LMP recorded.  OBJECTIVE:  BP 120/80  Pulse 60  Temp 98.1 F (36.7 C)  Wt 143 lb (64.864 kg)  She appears well, afebrile. Abdomen: benign, soft, nontender, no masses. Pelvic Exam: CERVIX: normal appearing cervix with discharge,  no lesions, WET MOUNT done - results: mobiluncus noted. GC/Chlamydia sent. Psych:  Alert, pleasant, not anxious or depressed appearing.     Assessment/Plan: 1. Vaginitis  New- wet prep positive for yeast. Treat with 150 mg po x 1. Sent GC/Chlamydia probe. GC/Chlamydia Amp Probe, Genital  2. 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  Will start Remeron 15 mg qhs, follow up in one month. Also check RPR, HIV and TSH. The patient indicates understanding of these issues and agrees with the plan.  RPR, HIV Antibody, TSH  3. Anemia  CBC with Differential

## 2011-11-18 ENCOUNTER — Ambulatory Visit (INDEPENDENT_AMBULATORY_CARE_PROVIDER_SITE_OTHER): Payer: Managed Care, Other (non HMO) | Admitting: Family Medicine

## 2011-11-18 ENCOUNTER — Encounter: Payer: Self-pay | Admitting: Family Medicine

## 2011-11-18 VITALS — BP 110/62 | HR 72 | Temp 97.9°F | Wt 145.0 lb

## 2011-11-18 DIAGNOSIS — R82998 Other abnormal findings in urine: Secondary | ICD-10-CM

## 2011-11-18 DIAGNOSIS — D649 Anemia, unspecified: Secondary | ICD-10-CM

## 2011-11-18 DIAGNOSIS — R8290 Unspecified abnormal findings in urine: Secondary | ICD-10-CM

## 2011-11-18 LAB — CBC WITH DIFFERENTIAL/PLATELET
Basophils Absolute: 0 10*3/uL (ref 0.0–0.1)
Eosinophils Absolute: 0.1 10*3/uL (ref 0.0–0.7)
Lymphocytes Relative: 33.6 % (ref 12.0–46.0)
MCHC: 33.1 g/dL (ref 30.0–36.0)
MCV: 87.5 fl (ref 78.0–100.0)
Monocytes Absolute: 0.6 10*3/uL (ref 0.1–1.0)
Neutro Abs: 3.7 10*3/uL (ref 1.4–7.7)
Neutrophils Relative %: 56.1 % (ref 43.0–77.0)
RDW: 13.1 % (ref 11.5–14.6)

## 2011-11-18 LAB — POCT URINALYSIS DIPSTICK
Blood, UA: NEGATIVE
Glucose, UA: NEGATIVE
Nitrite, UA: NEGATIVE
Spec Grav, UA: 1.02
Urobilinogen, UA: NEGATIVE

## 2011-11-18 NOTE — Patient Instructions (Signed)
Good to see you, Rebecca Castillo. Please drink plenty of fluids.  We will call you with your lab work from today.

## 2011-11-18 NOTE — Progress Notes (Signed)
SUBJECTIVE: Rebecca Castillo is a 36 y.o. female who complains of strong smelling urine , without dysuria, increased frequency, flank pain, fever, chills, or abnormal vaginal discharge or bleeding.   She admits to not drinking much fluid now that she no longer has pica- used to eat cups of ice per day. Patient Active Problem List  Diagnosis  . UNSPECIFIED URTICARIA  . FATIGUE  . UNSPECIFIED GENITAL HERPES  . ANEMIA, IRON DEFICIENCY  . Routine general medical examination at a health care facility  . Palpitations  . Family history of early CAD  . Umbilical hernia  . Anemia  . Vaginitis  . Unintentional weight loss   Past Medical History  Diagnosis Date  . 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 Anemia 07/08/2011  . Anemia 07/08/2011   Past Surgical History  Procedure Date  . Cesarean section     x 4   History  Substance Use Topics  . Smoking status: Never Smoker   . Smokeless tobacco: Not on file  . Alcohol Use: No   Family History  Problem Relation Age of Onset  . Hyperlipidemia      Family History  . Hypertension      Family history  . Stroke      Family History  . Heart disease Mother 66   No Known Allergies Current Outpatient Prescriptions on File Prior to Visit  Medication Sig Dispense Refill  . Aspirin-Salicylamide-Caffeine (BC HEADACHE POWDER PO) Take 1 packet by mouth daily as needed. For pain      . ferrous sulfate 325 (65 FE) MG tablet Take 325 mg by mouth every other day.      . mirtazapine (REMERON) 15 MG tablet Take 1 tablet (15 mg total) by mouth at bedtime.  30 tablet  1   The PMH, PSH, Social History, Family History, Medications, and allergies have been reviewed in [facility], and have been updated if relevant.  OBJECTIVE:  BP 110/62  Pulse 72  Temp 97.9 F (36.6 C)  Wt 145 lb (65.772 kg)  Appears well, in no apparent distress.  Vital signs are normal. The abdomen is soft without tenderness, guarding, mass, rebound or organomegaly. No CVA tenderness or  inguinal adenopathy noted. Urine dipstick shows negative for all components.    ASSESSMENT:  Strong odor of urine  PLAN:  UA neg- likely due to decreased fluid intake. Advised increased fluid intake. Will send urine for cx. The patient indicates understanding of these issues and agrees with the plan.

## 2011-11-18 NOTE — Addendum Note (Signed)
Addended by: Eliezer Bottom on: 11/18/2011 02:51 PM   Modules accepted: Orders

## 2011-11-20 LAB — URINE CULTURE: Colony Count: 50000

## 2012-01-24 ENCOUNTER — Emergency Department (HOSPITAL_COMMUNITY)
Admission: EM | Admit: 2012-01-24 | Discharge: 2012-01-25 | Disposition: A | Discharge status: Home / Self Care | Payer: Managed Care, Other (non HMO) | Attending: Emergency Medicine | Admitting: Emergency Medicine

## 2012-01-24 ENCOUNTER — Encounter (HOSPITAL_COMMUNITY): Payer: Self-pay | Admitting: Emergency Medicine

## 2012-01-24 DIAGNOSIS — K047 Periapical abscess without sinus: Secondary | ICD-10-CM

## 2012-01-24 DIAGNOSIS — K0889 Other specified disorders of teeth and supporting structures: Secondary | ICD-10-CM

## 2012-01-24 DIAGNOSIS — K029 Dental caries, unspecified: Secondary | ICD-10-CM

## 2012-01-24 DIAGNOSIS — Z862 Personal history of diseases of the blood and blood-forming organs and certain disorders involving the immune mechanism: Secondary | ICD-10-CM | POA: Insufficient documentation

## 2012-01-24 DIAGNOSIS — Z7982 Long term (current) use of aspirin: Secondary | ICD-10-CM | POA: Insufficient documentation

## 2012-01-24 DIAGNOSIS — Z872 Personal history of diseases of the skin and subcutaneous tissue: Secondary | ICD-10-CM | POA: Insufficient documentation

## 2012-01-24 NOTE — ED Notes (Signed)
Pt c/o toothache right lower back tooth, onset approx 7pm tonight  St's now right side of face is painful.

## 2012-01-25 MED ORDER — PENICILLIN V POTASSIUM 500 MG PO TABS: 500.0000 mg | ORAL_TABLET | Freq: Three times a day (TID) | ORAL | Status: DC

## 2012-01-25 MED ORDER — OXYCODONE-ACETAMINOPHEN 5-325 MG PO TABS: 1.0000 | ORAL_TABLET | Freq: Four times a day (QID) | ORAL | Status: DC | PRN

## 2012-01-25 NOTE — ED Provider Notes (Signed)
History     CSN: [id number]  Arrival date & time 01/24/12  2307   First MD Initiated Contact with Patient 01/24/12 2351      Chief Complaint  Patient presents with  . Dental Pain   HPI  History provided by the patient. Patient is a 36 year old female with no significant PMH who presents with complaints of increased dental pain. Patient reports having several dental problems for a long period time. She states she requires at least 11 teeth to be removed last time she went to the dentist but this will cost more than $2000 she is saving for this. Around 7 PM this evening she began to have increasing significant pain to the right lower teeth in the back. Pain radiates across the shot in the whole right face. She denies any swelling in the mouth. Denies any fever, chills or sweats. She denies any alleviating or aggravating factors.    Past Medical History  Diagnosis Date  . 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 Anemia 07/08/2011  . Anemia 07/08/2011    Past Surgical History  Procedure Date  . Cesarean section     x 4    Family History  Problem Relation Age of Onset  . Hyperlipidemia      Family History  . Hypertension      Family history  . Stroke      Family History  . Heart disease Mother 81    History  Substance Use Topics  . Smoking status: Never Smoker   . Smokeless tobacco: Not on file  . Alcohol Use: No    OB History    Grav Para Term Preterm Abortions TAB SAB Ect Mult Living                  Review of Systems  Constitutional: Negative for fever, chills and diaphoresis.  HENT: Negative for sore throat and dental problem.   Gastrointestinal: Negative for nausea and vomiting.  All other systems reviewed and are negative.    Allergies  Review of patient's allergies indicates no known allergies.  Home Medications   Current Outpatient Rx  Name  Route  Sig  Dispense  Refill  . ASPIRIN 325 MG PO TABS   Oral   Take 325 mg by mouth daily.         .  BC HEADACHE POWDER PO   Oral   Take 1 packet by mouth daily as needed. For pain           BP 133/83  Pulse 50  Temp 98.5 F (36.9 C) (Oral)  Resp 16  SpO2 100%  Physical Exam  Nursing note and vitals reviewed. Constitutional: She is oriented to person, place, and time. She appears well-developed and well-nourished. No distress.  HENT:  Head: Normocephalic.       Widespread dental decay. Multiple dental caries throughout. Several eroded teeth to the gumline. There is tenderness along the gums and remaining portions of teeth to the right lower mouth. No significant swelling of the gums. No swelling under the tongue.  Neck: Normal range of motion.  Cardiovascular: Normal rate and regular rhythm.   Pulmonary/Chest: Effort normal and breath sounds normal.  Lymphadenopathy:    She has no cervical adenopathy.  Neurological: She is alert and oriented to person, place, and time.  Skin: Skin is warm and dry. No rash noted.  Psychiatric: She has a normal mood and affect. Her behavior is normal.  ED Course  Procedures      1. Pain, dental   2. Dental caries   3. Periapical abscess       MDM  Patient seen and evaluated. Patient appears well in no acute distress.  Chronic dental pains for the past 2 years or more. Reports needing 11 teeth removed but can afford right now. Recently having more pain seemed to start around the right lower first molar that radiates up until all of her right upper molars. Widespread dental decay      Angus Seller, [place] 01/26/12 [phone]

## 2012-01-27 NOTE — ED Provider Notes (Signed)
Medical screening examination/treatment/procedure(s) were performed by non-physician practitioner and as supervising physician I was immediately available for consultation/collaboration.  Lizett Chowning, MD 01/27/12 1515 

## 2012-01-28 ENCOUNTER — Encounter (HOSPITAL_COMMUNITY): Payer: Self-pay | Admitting: *Deleted

## 2012-01-28 ENCOUNTER — Emergency Department (HOSPITAL_COMMUNITY)
Admission: EM | Admit: 2012-01-28 | Discharge: 2012-01-28 | Disposition: A | Discharge status: Home / Self Care | Payer: Managed Care, Other (non HMO) | Attending: Emergency Medicine | Admitting: Emergency Medicine

## 2012-01-28 ENCOUNTER — Emergency Department (HOSPITAL_COMMUNITY): Payer: Managed Care, Other (non HMO)

## 2012-01-28 DIAGNOSIS — J3489 Other specified disorders of nose and nasal sinuses: Secondary | ICD-10-CM | POA: Insufficient documentation

## 2012-01-28 DIAGNOSIS — Z872 Personal history of diseases of the skin and subcutaneous tissue: Secondary | ICD-10-CM | POA: Insufficient documentation

## 2012-01-28 DIAGNOSIS — R112 Nausea with vomiting, unspecified: Secondary | ICD-10-CM | POA: Insufficient documentation

## 2012-01-28 DIAGNOSIS — Z862 Personal history of diseases of the blood and blood-forming organs and certain disorders involving the immune mechanism: Secondary | ICD-10-CM | POA: Insufficient documentation

## 2012-01-28 DIAGNOSIS — R51 Headache: Secondary | ICD-10-CM | POA: Insufficient documentation

## 2012-01-28 DIAGNOSIS — B9789 Other viral agents as the cause of diseases classified elsewhere: Secondary | ICD-10-CM | POA: Insufficient documentation

## 2012-01-28 DIAGNOSIS — R0789 Other chest pain: Secondary | ICD-10-CM | POA: Insufficient documentation

## 2012-01-28 DIAGNOSIS — R05 Cough: Secondary | ICD-10-CM | POA: Insufficient documentation

## 2012-01-28 DIAGNOSIS — R059 Cough, unspecified: Secondary | ICD-10-CM | POA: Insufficient documentation

## 2012-01-28 DIAGNOSIS — Z3202 Encounter for pregnancy test, result negative: Secondary | ICD-10-CM | POA: Insufficient documentation

## 2012-01-28 DIAGNOSIS — R509 Fever, unspecified: Secondary | ICD-10-CM | POA: Insufficient documentation

## 2012-01-28 DIAGNOSIS — N390 Urinary tract infection, site not specified: Secondary | ICD-10-CM

## 2012-01-28 DIAGNOSIS — B349 Viral infection, unspecified: Secondary | ICD-10-CM

## 2012-01-28 DIAGNOSIS — IMO0001 Reserved for inherently not codable concepts without codable children: Secondary | ICD-10-CM | POA: Insufficient documentation

## 2012-01-28 LAB — CBC WITH DIFFERENTIAL/PLATELET
Basophils Absolute: 0 K/uL (ref 0.0–0.1)
Basophils Relative: 0 % (ref 0–1)
Eosinophils Absolute: 0 10*3/uL (ref 0.0–0.7)
Eosinophils Relative: 0 % (ref 0–5)
HCT: 37.2 % (ref 36.0–46.0)
Hemoglobin: 12.4 g/dL (ref 12.0–15.0)
Lymphocytes Relative: 5 % — ABNORMAL LOW (ref 12–46)
Lymphs Abs: 0.4 10*3/uL — ABNORMAL LOW (ref 0.7–4.0)
MCH: 28.4 pg (ref 26.0–34.0)
MCHC: 33.3 g/dL (ref 30.0–36.0)
MCV: 85.3 fL (ref 78.0–100.0)
Monocytes Absolute: 0.6 10*3/uL (ref 0.1–1.0)
Monocytes Relative: 9 % (ref 3–12)
Neutro Abs: 6.3 K/uL (ref 1.7–7.7)
Neutrophils Relative %: 86 % — ABNORMAL HIGH (ref 43–77)
Platelets: 277 K/uL (ref 150–400)
RBC: 4.36 MIL/uL (ref 3.87–5.11)
RDW: 12.8 % (ref 11.5–15.5)
WBC: 7.3 K/uL (ref 4.0–10.5)

## 2012-01-28 LAB — URINALYSIS, ROUTINE W REFLEX MICROSCOPIC
Bilirubin Urine: NEGATIVE
Glucose, UA: NEGATIVE mg/dL
Ketones, ur: NEGATIVE mg/dL
Leukocytes, UA: NEGATIVE
Nitrite: NEGATIVE
Protein, ur: NEGATIVE mg/dL
Specific Gravity, Urine: 1.02 (ref 1.005–1.030)
Urobilinogen, UA: 0.2 mg/dL (ref 0.0–1.0)
pH: 6 (ref 5.0–8.0)

## 2012-01-28 LAB — URINE MICROSCOPIC-ADD ON

## 2012-01-28 LAB — COMPREHENSIVE METABOLIC PANEL WITH GFR
ALT: 11 U/L (ref 0–35)
AST: 16 U/L (ref 0–37)
CO2: 24 meq/L (ref 19–32)
Chloride: 99 meq/L (ref 96–112)
GFR calc non Af Amer: >90 mL/min (ref >90)
Sodium: 134 meq/L — ABNORMAL LOW (ref 135–145)
Total Bilirubin: 0.3 mg/dL (ref 0.3–1.2)

## 2012-01-28 LAB — COMPREHENSIVE METABOLIC PANEL
Albumin: 3.9 g/dL (ref 3.5–5.2)
Alkaline Phosphatase: 72 U/L (ref 39–117)
BUN: 10 mg/dL (ref 6–23)
Calcium: 9 mg/dL (ref 8.4–10.5)
Creatinine, Ser: 0.77 mg/dL (ref 0.50–1.10)
GFR calc Af Amer: >90 mL/min (ref >90)
Glucose, Bld: 101 mg/dL — ABNORMAL HIGH (ref 70–99)
Potassium: 3.6 mEq/L (ref 3.5–5.1)
Total Protein: 7.7 g/dL (ref 6.0–8.3)

## 2012-01-28 LAB — POCT PREGNANCY, URINE: Preg Test, Ur: NEGATIVE

## 2012-01-28 LAB — LIPASE, BLOOD: Lipase: 35 U/L (ref 11–59)

## 2012-01-28 MED ORDER — BENZONATATE 100 MG PO CAPS: 100.0000 mg | ORAL_CAPSULE | Freq: Three times a day (TID) | ORAL | Status: DC

## 2012-01-28 MED ORDER — ACETAMINOPHEN 325 MG PO TABS
650.0000 mg | ORAL_TABLET | Freq: Once | ORAL | Status: AC
  Administered 2012-01-28: 650 mg via ORAL
  Filled 2012-01-28: qty 2

## 2012-01-28 MED ORDER — CEPHALEXIN 500 MG PO CAPS: 500.0000 mg | ORAL_CAPSULE | Freq: Two times a day (BID) | ORAL | Status: DC

## 2012-01-28 MED ORDER — DIPHENHYDRAMINE HCL 50 MG/ML IJ SOLN
25.0000 mg | Freq: Once | INTRAMUSCULAR | Status: AC
  Administered 2012-01-28: 25 mg via INTRAVENOUS
  Filled 2012-01-28: qty 1

## 2012-01-28 MED ORDER — METOCLOPRAMIDE HCL 5 MG/ML IJ SOLN
10.0000 mg | Freq: Once | INTRAMUSCULAR | Status: AC
  Administered 2012-01-28: 10 mg via INTRAVENOUS
  Filled 2012-01-28: qty 2

## 2012-01-28 MED ORDER — SODIUM CHLORIDE 0.9 % IV BOLUS (SEPSIS)
1000.0000 mL | Freq: Once | INTRAVENOUS | Status: AC
  Administered 2012-01-28: 1000 mL via INTRAVENOUS

## 2012-01-28 MED ORDER — TRAMADOL HCL 50 MG PO TABS: 50.0000 mg | ORAL_TABLET | Freq: Four times a day (QID) | ORAL | Status: DC | PRN

## 2012-01-28 NOTE — ED Provider Notes (Signed)
History     CSN: [id number]  Arrival date & time 01/28/12  1229   First MD Initiated Contact with Patient 01/28/12 1420      Chief Complaint  Patient presents with  . Generalized Body Aches    (Consider location/radiation/quality/duration/timing/severity/associated sxs/prior treatment) HPI  Rebecca Castillo is a 36 y.o. female otherwise healthy, complaining of c/o dry cough, myalgia,  Denies sore throat. Chest tightness x3 days ago, dry coughing yesterday, fever and posttussive emesis for this a.m.Marland Kitchen Emesis is nonbloody, nonbilious and non-coffee ground. Patient also has a headache 6/10, diffuse myalgia and rhinorrhea. She denies sore throat, chest pain, abdominal pain or shortness of breath, change in bowel or bladder habits, or neck stiffness. She has positive sick contacts at work. No flu shot this year.  Past Medical History  Diagnosis Date  . 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 Anemia 07/08/2011  . Anemia 07/08/2011    Past Surgical History  Procedure Date  . Cesarean section     x 4    Family History  Problem Relation Age of Onset  . Hyperlipidemia      Family History  . Hypertension      Family history  . Stroke      Family History  . Heart disease Mother 83    History  Substance Use Topics  . Smoking status: Never Smoker   . Smokeless tobacco: Not on file  . Alcohol Use: No    OB History    Grav Para Term Preterm Abortions TAB SAB Ect Mult Living                  Review of Systems  Constitutional: Positive for fever.  Respiratory: Positive for cough. Negative for shortness of breath.   Cardiovascular: Negative for chest pain.  Gastrointestinal: Positive for nausea and vomiting. Negative for abdominal pain and diarrhea.  All other systems reviewed and are negative.    Allergies  Oxycodone  Home Medications   Current Outpatient Rx  Name  Route  Sig  Dispense  Refill  . ASPIRIN 325 MG PO TBEC   Oral   Take 325 mg by mouth every 6 (six)  hours as needed. For pain           BP 105/61  Pulse 108  Temp 101 F (38.3 C) (Oral)  Resp 20  SpO2 100%  LMP 01/07/2012  Physical Exam  Nursing note and vitals reviewed. Constitutional: She is oriented to person, place, and time. She appears well-developed and well-nourished. No distress.  HENT:  Head: Normocephalic.  Mouth/Throat: Oropharynx is clear and moist.  Eyes: Conjunctivae normal and EOM are normal. Pupils are equal, round, and reactive to light.  Neck: Normal range of motion. Neck supple.  Cardiovascular: Normal rate, regular rhythm, normal heart sounds and intact distal pulses.   Pulmonary/Chest: Effort normal and breath sounds normal. No stridor. No respiratory distress. She has no wheezes. She has no rales. She exhibits no tenderness.  Abdominal: Soft. Bowel sounds are normal. She exhibits no distension and no mass. There is no tenderness. There is no rebound and no guarding.  Musculoskeletal: Normal range of motion.  Neurological: She is alert and oriented to person, place, and time.  Psychiatric: She has a normal mood and affect.    ED Course  Procedures (including critical care time)  Labs Reviewed  CBC WITH DIFFERENTIAL - Abnormal; Notable for the following:    Neutrophils Relative 86 (*)  Lymphocytes Relative 5 (*)     Lymphs Abs 0.4 (*)     All other components within normal limits  COMPREHENSIVE METABOLIC PANEL - Abnormal; Notable for the following:    Sodium 134 (*)     Glucose, Bld 101 (*)     All other components within normal limits  LIPASE, BLOOD  URINALYSIS, ROUTINE W REFLEX MICROSCOPIC   Dg Chest 2 View  01/28/2012  *RADIOLOGY REPORT*  Clinical Data: Wheezing and cough  CHEST - 2 VIEW  Comparison: None.  Findings: Lungs clear.  Heart size and pulmonary vascularity are normal.  No adenopathy.  No bone lesions.  IMPRESSION: No edema or consolidation.   Original Report Authenticated By: Bretta Bang, M.D.      1. Viral syndrome    2. UTI (lower urinary tract infection)       MDM  Flulike symptoms onset greater than 48 hours ago in a young and healthy person. She states that the symptom that is bothering her the most is the headache.  Plan is IV fluids, pain control, nausea control.   Patient reports significant subjective improvement after medications.  Will Tx UTI as Pt reports foul smelling urine and UA is consistent with a UTI   Pt verbalized understanding and agrees with care plan. Outpatient follow-up and return precautions given.    New Prescriptions   BENZONATATE (TESSALON) 100 MG CAPSULE    Take 1 capsule (100 mg total) by mouth every 8 (eight) hours.   CEPHALEXIN (KEFLEX) 500 MG CAPSULE    Take 1 capsule (500 mg total) by mouth 2 (two) times daily.   TRAMADOL (ULTRAM) 50 MG TABLET    Take 1 tablet (50 mg total) by mouth every 6 (six) hours as needed for pain.       Lanisa Ishler, PA-C 01/28/12 1734

## 2012-01-28 NOTE — ED Provider Notes (Signed)
Medical screening examination/treatment/procedure(s) were performed by non-physician practitioner and as supervising physician I was immediately available for consultation/collaboration.   Gavin Pound. Jakhari Space, MD 01/28/12 2249

## 2012-01-28 NOTE — ED Notes (Signed)
Pt reports body aches, cough, vomiting x yesterday.  Pt denies PTA meds.  Pt reports that she has had a fever but is unsure of what it was.  Pt also reports generalized pain with coughing.

## 2012-01-30 LAB — URINE CULTURE: Colony Count: >=100000

## 2012-01-31 NOTE — ED Notes (Signed)
+   Urine Patient treated with Keflex-sensitive to same-chart appended per protocol MD. 

## 2012-03-12 ENCOUNTER — Encounter: Payer: Self-pay | Admitting: *Deleted

## 2012-03-12 ENCOUNTER — Ambulatory Visit (INDEPENDENT_AMBULATORY_CARE_PROVIDER_SITE_OTHER): Payer: PRIVATE HEALTH INSURANCE | Admitting: Family Medicine

## 2012-03-12 ENCOUNTER — Encounter: Payer: Self-pay | Admitting: Family Medicine

## 2012-03-12 VITALS — BP 120/88 | HR 80 | Temp 98.4°F | Wt 150.0 lb

## 2012-03-12 DIAGNOSIS — K047 Periapical abscess without sinus: Secondary | ICD-10-CM

## 2012-03-12 MED ORDER — CLINDAMYCIN HCL 300 MG PO CAPS: 600.0000 mg | ORAL_CAPSULE | Freq: Three times a day (TID) | ORAL | Status: AC

## 2012-03-12 NOTE — Patient Instructions (Addendum)
Please start taking Clindamycin 600 mg three times daily for next 10 days. PLEASE make an appointment with a dentist immediately. Call me tomorrow with an update.

## 2012-03-12 NOTE — Progress Notes (Signed)
  Subjective:    Patient ID: Rebecca Castillo, female    DOB: Apr 03, 1975, 37 y.o.   MRN: [id number]  HPI  37 yo here for acute onset of right jaw pain and swelling x 2 days.  No known injury.  Started with a tooth ache last month but has not had money to go the dentist.  No fevers or chills.  No n/v/d.  Never had anything like this before.  Patient Active Problem List  Diagnosis  . UNSPECIFIED URTICARIA  . FATIGUE  . UNSPECIFIED GENITAL HERPES  . ANEMIA, IRON DEFICIENCY  . Routine general medical examination at a health care facility  . Palpitations  . Family history of early CAD  . Umbilical hernia  . Anemia  . Vaginitis  . Unintentional weight loss   Past Medical History  Diagnosis Date  . 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 Anemia 07/08/2011  . Anemia 07/08/2011   Past Surgical History  Procedure Date  . Cesarean section     x 4   History  Substance Use Topics  . Smoking status: Never Smoker   . Smokeless tobacco: Not on file  . Alcohol Use: No   Family History  Problem Relation Age of Onset  . Hyperlipidemia      Family History  . Hypertension      Family history  . Stroke      Family History  . Heart disease Mother 27   Allergies  Allergen Reactions  . Oxycodone Other (See Comments)   Current Outpatient Prescriptions on File Prior to Visit  Medication Sig Dispense Refill  . aspirin 325 MG EC tablet Take 325 mg by mouth every 6 (six) hours as needed. For pain       The PMH, PSH, Social History, Family History, Medications, and allergies have been reviewed in [facility], and have been updated if relevant.   Review of Systems  HENT: Positive for facial swelling. Negative for neck pain and neck stiffness.    See HPI    Objective:   Physical Exam  Constitutional: She appears well-developed. No distress.  HENT:  Mouth/Throat: Mucous membranes are normal. Dental abscesses and dental caries present.      Right jaw swollen, tender to touch with no  warmth or erythema      Assessment & Plan:  1.  Dental abscess-  Discussed the importance of seeing a dentist TODAY as these abscesses can become very serious and life threatening. Start Clindamycin 600 mg three times daily x 10 days.  Pt to call tomorrow with an update. The patient indicates understanding of these issues and agrees with the plan.

## 2012-03-19 ENCOUNTER — Telehealth: Payer: Self-pay | Admitting: Family Medicine

## 2012-03-19 MED ORDER — FLUCONAZOLE 150 MG PO TABS: 150.0000 mg | ORAL_TABLET | Freq: Once | ORAL | Status: DC

## 2012-03-19 NOTE — Telephone Encounter (Signed)
Pt thinks that she has developed yeast infection from abx and would like RX for diflucan.  Please advise.

## 2012-03-19 NOTE — Telephone Encounter (Signed)
Patient advised.

## 2012-03-19 NOTE — Telephone Encounter (Signed)
Rx for diflucan sent to [facility].  Please call us next week if symptoms have not resolved.

## 2013-01-05 ENCOUNTER — Emergency Department (HOSPITAL_COMMUNITY)
Admission: EM | Admit: 2013-01-05 | Discharge: 2013-01-05 | Disposition: A | Discharge status: Home / Self Care | Payer: Medicaid Other | Attending: Emergency Medicine | Admitting: Emergency Medicine

## 2013-01-05 ENCOUNTER — Encounter (HOSPITAL_COMMUNITY): Payer: Self-pay | Admitting: Emergency Medicine

## 2013-01-05 DIAGNOSIS — Z885 Allergy status to narcotic agent status: Secondary | ICD-10-CM | POA: Insufficient documentation

## 2013-01-05 DIAGNOSIS — R112 Nausea with vomiting, unspecified: Secondary | ICD-10-CM | POA: Insufficient documentation

## 2013-01-05 DIAGNOSIS — Z9889 Other specified postprocedural states: Secondary | ICD-10-CM | POA: Insufficient documentation

## 2013-01-05 DIAGNOSIS — D649 Anemia, unspecified: Secondary | ICD-10-CM | POA: Insufficient documentation

## 2013-01-05 MED ORDER — PROMETHAZINE HCL 25 MG PO TABS: 25.0000 mg | ORAL_TABLET | Freq: Four times a day (QID) | ORAL | Status: DC | PRN

## 2013-01-05 MED ORDER — ONDANSETRON HCL 4 MG/2ML IJ SOLN
4.0000 mg | Freq: Once | INTRAMUSCULAR | Status: AC
  Administered 2013-01-05: 4 mg via INTRAVENOUS
  Filled 2013-01-05: qty 2

## 2013-01-05 MED ORDER — KETOROLAC TROMETHAMINE 30 MG/ML IJ SOLN
30.0000 mg | Freq: Once | INTRAMUSCULAR | Status: AC
  Administered 2013-01-05: 30 mg via INTRAVENOUS
  Filled 2013-01-05: qty 1

## 2013-01-05 MED ORDER — SODIUM CHLORIDE 0.9 % IV BOLUS (SEPSIS)
1000.0000 mL | Freq: Once | INTRAVENOUS | Status: AC
  Administered 2013-01-05: 1000 mL via INTRAVENOUS

## 2013-01-05 MED ORDER — ONDANSETRON 4 MG PO TBDP: 4.0000 mg | ORAL_TABLET | Freq: Three times a day (TID) | ORAL | Status: DC | PRN

## 2013-01-05 NOTE — ED Notes (Signed)
Pt states she had 10 teeth pulled this morning and has had N/V since. Pt states that her pain has been controlled by the pain medicine prescribed but has not had any relief from nausea.

## 2013-01-05 NOTE — ED Provider Notes (Signed)
CSN: [id number]     Arrival date & time 01/05/13  0121 History   First MD Initiated Contact with Patient 01/05/13 0124     Chief Complaint  Patient presents with  . Emesis   (Consider location/radiation/quality/duration/timing/severity/associated sxs/prior Treatment) HPI Comments: 37 year old female with hx of dental extreaction done today > 8 teeth removed per pt - had sedation - went home at 1 PM and at 3 PM started getting nauseated - myltiple episodes of n/v today.  No blood or bilious vomit.  Nothing makes better or worse - no PO intake since.  Deneis abd pain, sob, cp but has headache and jaw pain.  No sig bleeding.  Patient is a 37 y.o. female presenting with vomiting. The history is provided by the patient.  Emesis   Past Medical History  Diagnosis Date  . 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 Anemia 07/08/2011  . Anemia 07/08/2011   Past Surgical History  Procedure Laterality Date  . Cesarean section      x 4  . Dental surgery     Family History  Problem Relation Age of Onset  . Hyperlipidemia      Family History  . Hypertension      Family history  . Stroke      Family History  . Heart disease Mother 74   History  Substance Use Topics  . Smoking status: Never Smoker   . Smokeless tobacco: Not on file  . Alcohol Use: No   OB History   Grav Para Term Preterm Abortions TAB SAB Ect Mult Living                 Review of Systems  Gastrointestinal: Positive for vomiting.  All other systems reviewed and are negative.    Allergies  Oxycodone  Home Medications   Current Outpatient Rx  Name  Route  Sig  Dispense  Refill  . amoxicillin (AMOXIL) 500 MG capsule   Oral   Take 500 mg by mouth 3 (three) times daily. For teeth extraction         . HYDROcodone-acetaminophen (NORCO/VICODIN) 5-325 MG per tablet   Oral   Take 1 tablet by mouth every 6 (six) hours as needed for moderate pain.         Marland Kitchen ondansetron (ZOFRAN ODT) 4 MG disintegrating tablet  Oral   Take 1 tablet (4 mg total) by mouth every 8 (eight) hours as needed for nausea.   10 tablet   0   . promethazine (PHENERGAN) 25 MG tablet   Oral   Take 1 tablet (25 mg total) by mouth every 6 (six) hours as needed for nausea or vomiting.   12 tablet   0    BP 114/65  Pulse 76  Temp(Src) 98.5 F (36.9 C) (Oral)  Resp 18  SpO2 98%  LMP 12/28/2012 Physical Exam  Nursing note and vitals reviewed. Constitutional: She appears well-developed and well-nourished. No distress.  HENT:  Head: Normocephalic and atraumatic.  Mouth/Throat: Oropharynx is clear and moist. No oropharyngeal exudate.  Multiple extracted teeth, no bleeding, normal secretions, patent OP  Eyes: Conjunctivae and EOM are normal. Pupils are equal, round, and reactive to light. Right eye exhibits no discharge. Left eye exhibits no discharge. No scleral icterus.  Neck: Normal range of motion. Neck supple. No JVD present. No thyromegaly present.  Cardiovascular: Normal rate, regular rhythm, normal heart sounds and intact distal pulses.  Exam reveals no gallop and no friction rub.  No murmur heard. Pulmonary/Chest: Effort normal and breath sounds normal. No respiratory distress. She has no wheezes. She has no rales.  Abdominal: Soft. Bowel sounds are normal. She exhibits no distension and no mass. There is no tenderness.  Musculoskeletal: Normal range of motion. She exhibits no edema and no tenderness.  Lymphadenopathy:    She has no cervical adenopathy.  Neurological: She is alert. Coordination normal.  Skin: Skin is warm and dry. No rash noted. No erythema.  Psychiatric: She has a normal mood and affect. Her behavior is normal.    ED Course  Procedures (including critical care time) Labs Review Labs Reviewed - No data to display Imaging Review No results found.  EKG Interpretation   None       MDM   1. Nausea and vomiting    Pt appears uncomfortable from nauseas but has no vomiting at this time.   VS normal - symptomatic meds, otherwise no other w/u at this time.  Improved with fluids and meds - stable for d/c.  Pt expresses understanding  Angiocath insertion Performed by: Vida Roller  Consent: Verbal consent obtained. Risks and benefits: risks, benefits and alternatives were discussed Time out: Immediately prior to procedure a "time out" was called to verify the correct patient, procedure, equipment, support staff and site/side marked as required.  Preparation: Patient was prepped and draped in the usual sterile fashion.  Vein Location: R AC  Not Ultrasound Guided  Gauge: 20  Normal blood return and flush without difficulty Patient tolerance: Patient tolerated the procedure well with no immediate complications.    Meds given in ED:  Medications  ondansetron (ZOFRAN) injection 4 mg (4 mg Intravenous Given 01/05/13 0149)  ketorolac (TORADOL) 30 MG/ML injection 30 mg (30 mg Intravenous Given 01/05/13 0149)  sodium chloride 0.9 % bolus 1,000 mL (0 mLs Intravenous Stopped 01/05/13 0316)    New Prescriptions   ONDANSETRON (ZOFRAN ODT) 4 MG DISINTEGRATING TABLET    Take 1 tablet (4 mg total) by mouth every 8 (eight) hours as needed for nausea.   PROMETHAZINE (PHENERGAN) 25 MG TABLET    Take 1 tablet (25 mg total) by mouth every 6 (six) hours as needed for nausea or vomiting.       Vida Roller, MD 01/05/13 [phone]

## 2013-01-05 NOTE — ED Notes (Signed)
Pt. reports multiple emesis this evening after oral surgery ( dental extractions ) yesterday .

## 2013-01-08 ENCOUNTER — Ambulatory Visit (INDEPENDENT_AMBULATORY_CARE_PROVIDER_SITE_OTHER): Payer: Medicaid Other | Admitting: Family Medicine

## 2013-01-08 ENCOUNTER — Encounter: Payer: Self-pay | Admitting: Family Medicine

## 2013-01-08 ENCOUNTER — Telehealth: Payer: Self-pay | Admitting: Family Medicine

## 2013-01-08 VITALS — BP 122/76 | HR 65 | Temp 98.5°F | Ht 67.5 in | Wt 162.2 lb

## 2013-01-08 DIAGNOSIS — R110 Nausea: Secondary | ICD-10-CM

## 2013-01-08 DIAGNOSIS — N760 Acute vaginitis: Secondary | ICD-10-CM

## 2013-01-08 LAB — POCT WET PREP (WET MOUNT): KOH Wet Prep POC: NEGATIVE

## 2013-01-08 MED ORDER — TERCONAZOLE 0.8 % VA CREA: TOPICAL_CREAM | VAGINAL | Status: DC

## 2013-01-08 MED ORDER — FLUCONAZOLE 150 MG PO TABS: 150.0000 mg | ORAL_TABLET | Freq: Once | ORAL | Status: DC

## 2013-01-08 NOTE — Telephone Encounter (Signed)
Patient Information:  Caller Name: Anyela  Phone: [phone]  Patient: Wendie, Diskin  Gender: Female  DOB: Dec 01, 1975  Age: 37 Years  PCP: Ruthe Mannan [facility])  Pregnant: No  Office Follow Up:  Does the office need to follow up with this patient?: No  Instructions For The Office: N/A  RN Note:  History of BTL. Advised must be seen for treatment  when office is open (and appointments available) due to protocol.  Symptoms  Reason For Call & Symptoms: Vaginal itching and discharge; taking Amoxicillin following multiple tooth extraction. Suspects vaginal yeast infection.  Reviewed Health History In EMR: Yes  Reviewed Medications In EMR: Yes  Reviewed Allergies In EMR: Yes  Reviewed Surgeries / Procedures: Yes  Date of Onset of Symptoms: 01/07/2013 OB / GYN:  LMP: 12/25/2012  Guideline(s) Used:  Vaginal Discharge  Disposition Per Guideline:   See Today in Office  Reason For Disposition Reached:   Patient wants to be seen  Advice Given:  Antifungal Medication for Vaginal Yeast Infection:   Available in the [place]: Femstat-3, miconazole (Monistat-3), clotrimazole (Gyne-Lotrimin-3, Mycelex-7), butoconazole (Femstat-3).  Do not use yeast medication during the 24 hours prior to a physician appointment (Reason: interferes with examination).  Genital Hygiene:   Keep your genital area clean. Wash daily.  Keep your genital area dry. Wear cotton underwear or underwear with a cotton crotch.  Do not douche.  Do not use feminine hygiene products.  Call Back If:  You become worse.  Patient Will Follow Care Advice:  YES  Appointment Scheduled:  01/08/2013 12:30:00 Appointment Scheduled Provider:  Roxy Manns [facility])

## 2013-01-08 NOTE — Assessment & Plan Note (Signed)
Pt has had nausea with tooth ext- on abx and also rxn to aneth Off the hydrocodone Imp on nausea med  Also a fair amt of post nasal drip

## 2013-01-08 NOTE — Progress Notes (Signed)
Subjective:    Patient ID: Rebecca Castillo, female    DOB: December 02, 1975, 37 y.o.   MRN: [id number]  HPI Here with vaginitis (pt of Dr Elmer Sow)  Had recent tooth extraction (10 teeth)  Was treated with a amoxicillin   Now has vaginitis symptoms  Has had yeast infection in the past  Discharge - white to clear  Starting to feel itchy and raw -more on the outside Cannot see any redness   She did not try over the counter products   She has had some nausea also and that started before the abx (also a bad rxn to her anethesia) Taking nausea med  Not taking the hydrocodone   No poss preg at all   Patient Active Problem List   Diagnosis Date Noted  . Nausea alone 01/08/2013  . Vaginitis 09/18/2011  . Unintentional weight loss 09/18/2011  . Anemia 07/08/2011  . Palpitations 07/01/2011  . Family history of early CAD 07/01/2011  . Umbilical hernia 07/01/2011  . Routine general medical examination at a health care facility 06/20/2011  . UNSPECIFIED GENITAL HERPES 03/19/2010  . ANEMIA, IRON DEFICIENCY 03/19/2010  . UNSPECIFIED URTICARIA 02/21/2010  . FATIGUE 02/21/2010   Past Medical History  Diagnosis Date  . 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 Anemia 07/08/2011  . Anemia 07/08/2011   Past Surgical History  Procedure Laterality Date  . Cesarean section      x 4  . Dental surgery     History  Substance Use Topics  . Smoking status: Never Smoker   . Smokeless tobacco: Not on file  . Alcohol Use: No   Family History  Problem Relation Age of Onset  . Hyperlipidemia      Family History  . Hypertension      Family history  . Stroke      Family History  . Heart disease Mother 1   Allergies  Allergen Reactions  . Oxycodone Other (See Comments)    Light headed   Current Outpatient Prescriptions on File Prior to Visit  Medication Sig Dispense Refill  . HYDROcodone-acetaminophen (NORCO/VICODIN) 5-325 MG per tablet Take 1 tablet by mouth every 6 (six) hours as needed  for moderate pain.      Marland Kitchen ondansetron (ZOFRAN ODT) 4 MG disintegrating tablet Take 1 tablet (4 mg total) by mouth every 8 (eight) hours as needed for nausea.  10 tablet  0  . promethazine (PHENERGAN) 25 MG tablet Take 1 tablet (25 mg total) by mouth every 6 (six) hours as needed for nausea or vomiting.  12 tablet  0  . amoxicillin (AMOXIL) 500 MG capsule Take 500 mg by mouth 3 (three) times daily. For teeth extraction       No current facility-administered medications on file prior to visit.     Review of Systems Review of Systems  Constitutional: Negative for fever, appetite change, fatigue and unexpected weight change.  Eyes: Negative for pain and visual disturbance.  Respiratory: Negative for cough and shortness of breath.   Cardiovascular: Negative for cp or palpitations    Gastrointestinal: Negative for nausea, diarrhea and constipation.  Genitourinary: Negative for urgency and frequency. neg for dysuria/suprapubic pain or hematuria  Skin: Negative for pallor or rash   Neurological: Negative for weakness, light-headedness, numbness and headaches.  Hematological: Negative for adenopathy. Does not bruise/bleed easily.  Psychiatric/Behavioral: Negative for dysphoric mood. The patient is not nervous/anxious.         Objective:   Physical Exam  Constitutional: She appears well-developed and well-nourished. No distress.  HENT:  Head: Normocephalic and atraumatic.  Mouth/Throat: Oropharynx is clear and moist.  Neck: Normal range of motion. Neck supple.  Cardiovascular: Normal rate.   Pulmonary/Chest: Breath sounds normal. No respiratory distress. She has no wheezes. She has no rales.  Abdominal: Soft. Bowel sounds are normal. She exhibits no distension and no mass. There is no tenderness.  No suprapubic tenderness or fullness    Genitourinary:  White thick vaginal d/c with mild hyperemia of mucosa No lesions  Lymphadenopathy:    She has no cervical adenopathy.  Neurological: She  is alert.  Skin: Skin is warm and dry. No rash noted. No erythema. No pallor.  Psychiatric: She has a normal mood and affect.          Assessment & Plan:

## 2013-01-08 NOTE — Progress Notes (Signed)
Pre-visit discussion using our clinic review tool. No additional management support is needed unless otherwise documented below in the visit note.  

## 2013-01-08 NOTE — Telephone Encounter (Signed)
Will see her then 

## 2013-01-08 NOTE — Assessment & Plan Note (Signed)
During tx with amoxicillin tx with diflucan 150 orally and terazol cream daily ext prn Update if not starting to improve in a week or if worsening   Disc keeping area as dry as possible

## 2013-01-08 NOTE — Patient Instructions (Signed)
You have a yeast infection  Take the diflucan pill once orally Use the terazol cream as needed daily to affected areas Let us know if no improvement

## 2013-03-16 ENCOUNTER — Other Ambulatory Visit: Payer: Self-pay | Admitting: Family Medicine

## 2013-03-16 DIAGNOSIS — D649 Anemia, unspecified: Secondary | ICD-10-CM

## 2013-03-16 DIAGNOSIS — Z136 Encounter for screening for cardiovascular disorders: Secondary | ICD-10-CM

## 2013-03-16 DIAGNOSIS — Z0000 Encounter for general adult medical examination without abnormal findings: Secondary | ICD-10-CM

## 2013-03-25 ENCOUNTER — Other Ambulatory Visit (INDEPENDENT_AMBULATORY_CARE_PROVIDER_SITE_OTHER): Payer: Medicaid Other

## 2013-03-25 DIAGNOSIS — R5381 Other malaise: Secondary | ICD-10-CM

## 2013-03-25 DIAGNOSIS — D509 Iron deficiency anemia, unspecified: Secondary | ICD-10-CM

## 2013-03-25 DIAGNOSIS — Z136 Encounter for screening for cardiovascular disorders: Secondary | ICD-10-CM

## 2013-03-25 DIAGNOSIS — D649 Anemia, unspecified: Secondary | ICD-10-CM

## 2013-03-25 DIAGNOSIS — R5383 Other fatigue: Secondary | ICD-10-CM

## 2013-03-25 DIAGNOSIS — Z0000 Encounter for general adult medical examination without abnormal findings: Secondary | ICD-10-CM

## 2013-03-25 LAB — CBC WITH DIFFERENTIAL/PLATELET
BASOS PCT: 0.4 % (ref 0.0–3.0)
Basophils Absolute: 0 10*3/uL (ref 0.0–0.1)
EOS PCT: 1.4 % (ref 0.0–5.0)
Eosinophils Absolute: 0.1 10*3/uL (ref 0.0–0.7)
HCT: 31.4 % — ABNORMAL LOW (ref 36.0–46.0)
HEMOGLOBIN: 10.2 g/dL — AB (ref 12.0–15.0)
LYMPHS PCT: 47.2 % — AB (ref 12.0–46.0)
Lymphs Abs: 3 10*3/uL (ref 0.7–4.0)
MCHC: 32.6 g/dL (ref 30.0–36.0)
MCV: 76.6 fl — ABNORMAL LOW (ref 78.0–100.0)
MONOS PCT: 8.7 % (ref 3.0–12.0)
Monocytes Absolute: 0.6 10*3/uL (ref 0.1–1.0)
NEUTROS ABS: 2.7 10*3/uL (ref 1.4–7.7)
NEUTROS PCT: 42.3 % — AB (ref 43.0–77.0)
Platelets: 330 10*3/uL (ref 150.0–400.0)
RBC: 4.09 Mil/uL (ref 3.87–5.11)
RDW: 16.1 % — ABNORMAL HIGH (ref 11.5–14.6)
WBC: 6.4 10*3/uL (ref 4.5–10.5)

## 2013-03-25 LAB — LIPID PANEL
CHOLESTEROL: 145 mg/dL (ref 0–200)
HDL: 38.1 mg/dL — ABNORMAL LOW (ref >39.00)
LDL CALC: 93 mg/dL (ref 0–99)
Total CHOL/HDL Ratio: 4
Triglycerides: 70 mg/dL (ref 0.0–149.0)
VLDL: 14 mg/dL (ref 0.0–40.0)

## 2013-03-25 LAB — COMPREHENSIVE METABOLIC PANEL
ALBUMIN: 3.7 g/dL (ref 3.5–5.2)
ALK PHOS: 50 U/L (ref 39–117)
ALT: 14 U/L (ref 0–35)
AST: 14 U/L (ref 0–37)
BILIRUBIN TOTAL: 0.5 mg/dL (ref 0.3–1.2)
BUN: 15 mg/dL (ref 6–23)
CO2: 24 meq/L (ref 19–32)
Calcium: 8.7 mg/dL (ref 8.4–10.5)
Chloride: 106 mEq/L (ref 96–112)
Creatinine, Ser: 0.7 mg/dL (ref 0.4–1.2)
GFR: 114.12 mL/min (ref >60.00)
GLUCOSE: 84 mg/dL (ref 70–99)
POTASSIUM: 3.8 meq/L (ref 3.5–5.1)
SODIUM: 136 meq/L (ref 135–145)
TOTAL PROTEIN: 7.3 g/dL (ref 6.0–8.3)

## 2013-03-25 LAB — TSH: TSH: 0.8 u[IU]/mL (ref 0.35–5.50)

## 2013-03-28 ENCOUNTER — Encounter (HOSPITAL_COMMUNITY): Payer: Self-pay | Admitting: Emergency Medicine

## 2013-03-28 ENCOUNTER — Emergency Department (HOSPITAL_COMMUNITY): Payer: Medicaid Other

## 2013-03-28 ENCOUNTER — Emergency Department (HOSPITAL_COMMUNITY)
Admission: EM | Admit: 2013-03-28 | Discharge: 2013-03-28 | Disposition: A | Discharge status: Home / Self Care | Payer: Medicaid Other | Attending: Emergency Medicine | Admitting: Emergency Medicine

## 2013-03-28 DIAGNOSIS — M1990 Unspecified osteoarthritis, unspecified site: Secondary | ICD-10-CM

## 2013-03-28 DIAGNOSIS — Z791 Long term (current) use of non-steroidal anti-inflammatories (NSAID): Secondary | ICD-10-CM | POA: Insufficient documentation

## 2013-03-28 DIAGNOSIS — Z862 Personal history of diseases of the blood and blood-forming organs and certain disorders involving the immune mechanism: Secondary | ICD-10-CM | POA: Insufficient documentation

## 2013-03-28 DIAGNOSIS — M19079 Primary osteoarthritis, unspecified ankle and foot: Secondary | ICD-10-CM | POA: Insufficient documentation

## 2013-03-28 DIAGNOSIS — Z872 Personal history of diseases of the skin and subcutaneous tissue: Secondary | ICD-10-CM | POA: Insufficient documentation

## 2013-03-28 MED ORDER — KETOROLAC TROMETHAMINE 60 MG/2ML IM SOLN
60.0000 mg | Freq: Once | INTRAMUSCULAR | Status: AC
  Administered 2013-03-28: 60 mg via INTRAMUSCULAR
  Filled 2013-03-28: qty 2

## 2013-03-28 MED ORDER — NAPROXEN 500 MG PO TABS: 500.0000 mg | ORAL_TABLET | Freq: Two times a day (BID) | ORAL | Status: DC

## 2013-03-28 MED ORDER — HYDROCODONE-ACETAMINOPHEN 5-325 MG PO TABS
2.0000 | ORAL_TABLET | ORAL | Status: DC | PRN
Start: 2013-03-28 — End: 2013-04-22

## 2013-03-28 NOTE — ED Notes (Signed)
Painful swollen lt ankle for 2 days.  No known injury  No previous history

## 2013-03-28 NOTE — ED Provider Notes (Signed)
CSN: [id number]     Arrival date & time 03/28/13  0645 History   First MD Initiated Contact with Patient 03/28/13 [phone]     Chief Complaint  Patient presents with  . Ankle Pain   (Consider location/radiation/quality/duration/timing/severity/associated sxs/prior Treatment) HPI Comments: Patient presents with a two-day history of worsening swelling and throbbing pain to her left ankle. She denies any known injury to her ankle. She denies any history of other joint swelling. She denies any history of arthritis or gout however she does have a family history of gout in her mom. She denies any fevers or chills. She denies any overlying wounds to the area. She's been taking ibuprofen with some relief in symptoms. She denies any swelling of her calf. She denies any pain to her calf or groin area. She states the pain is worse when she's putting pressure on her leg.  Patient is a 38 y.o. female presenting with ankle pain.  Ankle Pain Associated symptoms: no back pain, no fever and no neck pain     Past Medical History  Diagnosis Date  . 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 Anemia 07/08/2011  . Anemia 07/08/2011   Past Surgical History  Procedure Laterality Date  . Cesarean section      x 4  . Dental surgery     Family History  Problem Relation Age of Onset  . Hyperlipidemia      Family History  . Hypertension      Family history  . Stroke      Family History  . Heart disease Mother 41   History  Substance Use Topics  . Smoking status: Never Smoker   . Smokeless tobacco: Not on file  . Alcohol Use: No   OB History   Grav Para Term Preterm Abortions TAB SAB Ect Mult Living                 Review of Systems  Constitutional: Negative for fever.  Gastrointestinal: Negative for nausea and vomiting.  Musculoskeletal: Positive for arthralgias and joint swelling. Negative for back pain and neck pain.  Skin: Negative for wound.  Neurological: Negative for weakness, numbness and  headaches.    Allergies  Oxycodone  Home Medications   Current Outpatient Rx  Name  Route  Sig  Dispense  Refill  . HYDROcodone-acetaminophen (NORCO/VICODIN) 5-325 MG per tablet   Oral   Take 2 tablets by mouth every 4 (four) hours as needed.   15 tablet   0   . naproxen (NAPROSYN) 500 MG tablet   Oral   Take 1 tablet (500 mg total) by mouth 2 (two) times daily.   30 tablet   0    BP 121/81  Pulse 72  Temp(Src) 98.1 F (36.7 C) (Oral)  Resp 22  Ht 5\' 5"  (1.651 m)  Wt 165 lb (74.844 kg)  BMI 27.46 kg/m2  SpO2 98%  LMP 03/07/2013 Physical Exam  Constitutional: She is oriented to person, place, and time. She appears well-developed and well-nourished.  HENT:  Head: Normocephalic and atraumatic.  Neck: Normal range of motion. Neck supple.  Cardiovascular: Normal rate.   Pulmonary/Chest: Effort normal.  Musculoskeletal: She exhibits edema and tenderness.  Pt with swelling/small effusion to left ankle.  +TTP diffusely around ankle and with ROM or joint.  No warmth, erythema, wounds.  No pain to foot or lower leg.  Swelling is isolated to ankle joint.  NVI.  Neurological: She is alert and oriented  to person, place, and time.  Skin: Skin is warm and dry.  Psychiatric: She has a normal mood and affect.    ED Course  Procedures (including critical care time) Labs Review Labs Reviewed - No data to display Imaging Review Dg Ankle Complete Left  03/28/2013   CLINICAL DATA:  Left ankle pain and swelling.  No known injury.  EXAM: LEFT ANKLE COMPLETE - 3+ VIEW  COMPARISON:  None.  FINDINGS: Diffuse soft tissue swelling, most pronounced laterally. Mild dorsal spur formation at the proximal navicular. Otherwise common appearing bones. No effusion.  IMPRESSION: Soft tissue swelling without acute underlying bony abnormality.   Electronically Signed   By: Enrique Sack M.D.   On: 03/28/2013 07:21    EKG Interpretation   None       MDM   1. Arthritis    Pt presents with left  ankle swelling.  No bony injuries identified.  No suggestions of DVT.  I feel exam is more consistent with inflammatory arthritis rather than septic joint.  Advised pt in ice, elevation.  Given crutches and rx for naproxen and vicodin.  Pt given referral to ortho.  Advised to be seen if not getting better or any worsening swelling, redness.    Malvin Johns, MD 03/28/13 [phone]

## 2013-03-28 NOTE — Discharge Instructions (Signed)

## 2013-03-28 NOTE — ED Notes (Signed)
Left lower ankle: woke up yesterday with swelling and pain. Denies falling or any activity leading to ankle swelling and pain. Cms intact. Did take advil with min. Relief.

## 2013-03-28 NOTE — ED Notes (Signed)
Patient discharged to home with family. NAD.  

## 2013-04-01 ENCOUNTER — Other Ambulatory Visit (HOSPITAL_COMMUNITY)
Admission: RE | Admit: 2013-04-01 | Discharge: 2013-04-01 | Disposition: A | Discharge status: Home / Self Care | Payer: Medicaid Other | Source: Ambulatory Visit | Attending: Family Medicine | Admitting: Family Medicine

## 2013-04-01 ENCOUNTER — Telehealth: Payer: Self-pay | Admitting: Oncology

## 2013-04-01 ENCOUNTER — Ambulatory Visit (INDEPENDENT_AMBULATORY_CARE_PROVIDER_SITE_OTHER): Payer: Medicaid Other | Admitting: Family Medicine

## 2013-04-01 VITALS — BP 116/70 | HR 66 | Temp 98.0°F | Ht 67.0 in | Wt 166.5 lb

## 2013-04-01 DIAGNOSIS — Z124 Encounter for screening for malignant neoplasm of cervix: Secondary | ICD-10-CM | POA: Insufficient documentation

## 2013-04-01 DIAGNOSIS — Z113 Encounter for screening for infections with a predominantly sexual mode of transmission: Secondary | ICD-10-CM | POA: Insufficient documentation

## 2013-04-01 DIAGNOSIS — D649 Anemia, unspecified: Secondary | ICD-10-CM

## 2013-04-01 DIAGNOSIS — K429 Umbilical hernia without obstruction or gangrene: Secondary | ICD-10-CM

## 2013-04-01 DIAGNOSIS — N760 Acute vaginitis: Secondary | ICD-10-CM | POA: Insufficient documentation

## 2013-04-01 DIAGNOSIS — Z01419 Encounter for gynecological examination (general) (routine) without abnormal findings: Secondary | ICD-10-CM

## 2013-04-01 DIAGNOSIS — Z1151 Encounter for screening for human papillomavirus (HPV): Secondary | ICD-10-CM | POA: Insufficient documentation

## 2013-04-01 DIAGNOSIS — Z0000 Encounter for general adult medical examination without abnormal findings: Secondary | ICD-10-CM

## 2013-04-01 DIAGNOSIS — M129 Arthropathy, unspecified: Secondary | ICD-10-CM

## 2013-04-01 DIAGNOSIS — M1990 Unspecified osteoarthritis, unspecified site: Secondary | ICD-10-CM | POA: Insufficient documentation

## 2013-04-01 NOTE — Telephone Encounter (Signed)
pt called to r/s missed appt....done....pt aware of d.t.

## 2013-04-01 NOTE — Assessment & Plan Note (Signed)
Pap smear today. 

## 2013-04-01 NOTE — Assessment & Plan Note (Signed)
Refer to surgery 

## 2013-04-01 NOTE — Progress Notes (Signed)
Pre-visit discussion using our clinic review tool. No additional management support is needed unless otherwise documented below in the visit note.  

## 2013-04-01 NOTE — Patient Instructions (Addendum)
Good to see you. We will call you with a referral to a surgeon.  We will call you with your pap smear and lab results.  Please call Dr. Alen Blew, your hematologist.

## 2013-04-01 NOTE — Progress Notes (Signed)
Subjective:   Patient ID: Rebecca Castillo, female    DOB: [birth year], 38 y.o.   MRN: [id number]  Rebecca Castillo is a pleasant 38 y.o. year old female who presents to clinic today with Annual Exam  on 04/01/2013  HPI:  Well woman- sexually active with one partner. S/p BTL.  Anemia- was seeing Dr. Alen Blew- has needed IV iron in past. Feeling more fatigued. Lab Results  Component Value Date   WBC 6.4 03/25/2013   HGB 10.2* 03/25/2013   HCT 31.4* 03/25/2013   MCV 76.6* 03/25/2013   PLT 629.5 03/28/4130   Umbilical hernia- not hurting but getting larger.  She does want to get it repaired at this point.  Arthritis- went to ER this week for left ankle swelling which has resolved.  Was told it was arthritis. Taking as needed alleve.  Dg Ankle Complete Left  03/28/2013   CLINICAL DATA:  Left ankle pain and swelling.  No known injury.  EXAM: LEFT ANKLE COMPLETE - 3+ VIEW  COMPARISON:  None.  FINDINGS: Diffuse soft tissue swelling, most pronounced laterally. Mild dorsal spur formation at the proximal navicular. Otherwise common appearing bones. No effusion.  IMPRESSION: Soft tissue swelling without acute underlying bony abnormality.   Electronically Signed   By: Enrique Sack M.D.   On: 03/28/2013 07:21   Patient Active Problem List   Diagnosis Date Noted  . Encounter for routine gynecological examination 04/01/2013  . Arthritis 04/01/2013  . Nausea alone 01/08/2013  . Yeast vaginitis 09/18/2011  . Unintentional weight loss 09/18/2011  . Anemia 07/08/2011  . Palpitations 07/01/2011  . Family history of early CAD 07/01/2011  . Umbilical hernia 44/02/270  . Routine general medical examination at a health care facility 06/20/2011  . UNSPECIFIED GENITAL HERPES 03/19/2010  . ANEMIA, IRON DEFICIENCY 03/19/2010  . UNSPECIFIED URTICARIA 02/21/2010  . FATIGUE 02/21/2010   Past Medical History  Diagnosis Date  . 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 Anemia 07/08/2011  . Anemia 07/08/2011   Past  Surgical History  Procedure Laterality Date  . Cesarean section      x 4  . Dental surgery     History  Substance Use Topics  . Smoking status: Never Smoker   . Smokeless tobacco: Not on file  . Alcohol Use: No   Family History  Problem Relation Age of Onset  . Hyperlipidemia      Family History  . Hypertension      Family history  . Stroke      Family History  . Heart disease Mother 4   Allergies  Allergen Reactions  . Oxycodone Other (See Comments)    Light headed   Current Outpatient Prescriptions on File Prior to Visit  Medication Sig Dispense Refill  . HYDROcodone-acetaminophen (NORCO/VICODIN) 5-325 MG per tablet Take 2 tablets by mouth every 4 (four) hours as needed.  15 tablet  0  . naproxen (NAPROSYN) 500 MG tablet Take 1 tablet (500 mg total) by mouth 2 (two) times daily.  30 tablet  0   No current facility-administered medications on file prior to visit.   The PMH, PSH, Social History, Family History, Medications, and allergies have been reviewed in [facility], and have been updated if relevant.   Review of Systems  Constitutional: Positive for fatigue. Negative for fever and appetite change.  Cardiovascular: Negative.   Gastrointestinal: Negative.   Genitourinary: Negative.   Psychiatric/Behavioral: Negative.   All other systems reviewed and are negative.  Objective:    BP 116/70  Pulse 66  Temp(Src) 98 F (36.7 C) (Oral)  Ht 5\' 7"  (1.702 m)  Wt 166 lb 8 oz (75.524 kg)  BMI 26.07 kg/m2  SpO2 98%  LMP 03/07/2013   Physical Exam   General:  Well-developed,well-nourished,in no acute distress; alert,appropriate and cooperative throughout examination Head:  normocephalic and atraumatic.   Eyes:  vision grossly intact, pupils equal, pupils round, and pupils reactive to light.   Ears:  R ear normal and L ear normal.   Nose:  no external deformity.   Mouth:  good dentition.   Neck:  No deformities, masses, or tenderness noted. Breasts:  No  mass, nodules, thickening, tenderness, bulging, retraction, inflamation, nipple discharge or skin changes noted.   Lungs:  Normal respiratory effort, chest expands symmetrically. Lungs are clear to auscultation, no crackles or wheezes. Heart:  Normal rate and regular rhythm. S1 and S2 normal without gallop, murmur, click, rub or other extra sounds. Abdomen:  Bowel sounds positive,abdomen soft and non-tender without masses Umbilical hernia Rectal:  no external abnormalities.   Genitalia:  Pelvic Exam:        External: normal female genitalia without lesions or masses        Vagina: normal without lesions or masses        Cervix: normal without lesions or masses        Adnexa: normal bimanual exam without masses or fullness        Uterus: normal by palpation        Pap smear: performed Msk:  No deformity or scoliosis noted of thoracic or lumbar spine.   Extremities:  No clubbing, cyanosis, edema, or deformity noted with normal full range of motion of all joints.   Neurologic:  alert & oriented X3 and gait normal.   Skin:  Intact without suspicious lesions or rashes Cervical Nodes:  No lymphadenopathy noted Axillary Nodes:  No palpable lymphadenopathy Psych:  Cognition and judgment appear intact. Alert and cooperative with normal attention span and concentration. No apparent delusions, illusions, hallucinations       Assessment & Plan:   Routine general medical examination at a health care facility  Encounter for routine gynecological examination  Anemia  Arthritis No Follow-up on file.

## 2013-04-01 NOTE — Assessment & Plan Note (Signed)
Improved

## 2013-04-01 NOTE — Assessment & Plan Note (Signed)
Deteriorated, symptomatic and not taking oral iron.  Advised to restart this and call hematology for follow up. The patient indicates understanding of these issues and agrees with the plan.

## 2013-04-01 NOTE — Assessment & Plan Note (Signed)
Reviewed preventive care protocols, scheduled due services, and updated immunizations Discussed nutrition, exercise, diet, and healthy lifestyle.  

## 2013-04-02 LAB — HIV ANTIBODY (ROUTINE TESTING W REFLEX): HIV: NONREACTIVE

## 2013-04-02 LAB — RPR

## 2013-04-05 ENCOUNTER — Other Ambulatory Visit: Payer: Self-pay | Admitting: *Deleted

## 2013-04-05 MED ORDER — METRONIDAZOLE 500 MG PO TABS: 500.0000 mg | ORAL_TABLET | Freq: Two times a day (BID) | ORAL | Status: DC

## 2013-04-06 LAB — CERVICOVAGINAL ANCILLARY ONLY: HERPES (WINDOWPATH): NEGATIVE

## 2013-04-08 ENCOUNTER — Other Ambulatory Visit: Payer: Self-pay | Admitting: Oncology

## 2013-04-08 DIAGNOSIS — D509 Iron deficiency anemia, unspecified: Secondary | ICD-10-CM

## 2013-04-09 ENCOUNTER — Other Ambulatory Visit (HOSPITAL_BASED_OUTPATIENT_CLINIC_OR_DEPARTMENT_OTHER): Payer: Medicaid Other

## 2013-04-09 ENCOUNTER — Encounter: Payer: Self-pay | Admitting: Oncology

## 2013-04-09 ENCOUNTER — Telehealth: Payer: Self-pay | Admitting: Oncology

## 2013-04-09 ENCOUNTER — Ambulatory Visit (HOSPITAL_BASED_OUTPATIENT_CLINIC_OR_DEPARTMENT_OTHER): Payer: Medicaid Other

## 2013-04-09 ENCOUNTER — Ambulatory Visit (HOSPITAL_BASED_OUTPATIENT_CLINIC_OR_DEPARTMENT_OTHER): Payer: Medicaid Other | Admitting: Oncology

## 2013-04-09 VITALS — BP 121/80 | HR 60 | Temp 98.1°F | Resp 18 | Ht 67.0 in | Wt 167.5 lb

## 2013-04-09 DIAGNOSIS — N926 Irregular menstruation, unspecified: Secondary | ICD-10-CM

## 2013-04-09 DIAGNOSIS — D509 Iron deficiency anemia, unspecified: Secondary | ICD-10-CM

## 2013-04-09 LAB — CBC WITH DIFFERENTIAL/PLATELET
BASO%: 0.6 % (ref 0.0–2.0)
BASOS ABS: 0 10*3/uL (ref 0.0–0.1)
EOS ABS: 0.2 10*3/uL (ref 0.0–0.5)
EOS%: 2.7 % (ref 0.0–7.0)
HCT: 31 % — ABNORMAL LOW (ref 34.8–46.6)
HEMOGLOBIN: 9.9 g/dL — AB (ref 11.6–15.9)
LYMPH#: 2.2 10*3/uL (ref 0.9–3.3)
LYMPH%: 37.9 % (ref 14.0–49.7)
MCH: 24.2 pg — ABNORMAL LOW (ref 25.1–34.0)
MCHC: 32.1 g/dL (ref 31.5–36.0)
MCV: 75.4 fL — AB (ref 79.5–101.0)
MONO#: 0.6 10*3/uL (ref 0.1–0.9)
MONO%: 9.7 % (ref 0.0–14.0)
NEUT%: 49.1 % (ref 38.4–76.8)
NEUTROS ABS: 2.9 10*3/uL (ref 1.5–6.5)
Platelets: 319 10*3/uL (ref 145–400)
RBC: 4.11 10*6/uL (ref 3.70–5.45)
RDW: 16 % — AB (ref 11.2–14.5)
WBC: 5.8 10*3/uL (ref 3.9–10.3)

## 2013-04-09 LAB — COMPREHENSIVE METABOLIC PANEL (CC13)
ALBUMIN: 3.7 g/dL (ref 3.5–5.0)
ALK PHOS: 55 U/L (ref 40–150)
ALT: 9 U/L (ref 0–55)
AST: 17 U/L (ref 5–34)
Anion Gap: 6 mEq/L (ref 3–11)
BUN: 14.2 mg/dL (ref 7.0–26.0)
CALCIUM: 8.6 mg/dL (ref 8.4–10.4)
CHLORIDE: 109 meq/L (ref 98–109)
CO2: 23 mEq/L (ref 22–29)
Creatinine: 0.7 mg/dL (ref 0.6–1.1)
GLUCOSE: 91 mg/dL (ref 70–140)
POTASSIUM: 3.8 meq/L (ref 3.5–5.1)
SODIUM: 138 meq/L (ref 136–145)
TOTAL PROTEIN: 6.7 g/dL (ref 6.4–8.3)
Total Bilirubin: 0.34 mg/dL (ref 0.20–1.20)

## 2013-04-09 LAB — IRON AND TIBC CHCC
%SAT: 7 % — ABNORMAL LOW (ref 21–57)
IRON: 28 ug/dL — AB (ref 41–142)
TIBC: 394 ug/dL (ref 236–444)
UIBC: 366 ug/dL (ref 120–384)

## 2013-04-09 LAB — FERRITIN CHCC

## 2013-04-09 MED ORDER — SODIUM CHLORIDE 0.9 % IV SOLN
Freq: Once | INTRAVENOUS | Status: AC
  Administered 2013-04-09: 10:00:00 via INTRAVENOUS

## 2013-04-09 MED ORDER — SODIUM CHLORIDE 0.9 % IV SOLN
1020.0000 mg | Freq: Once | INTRAVENOUS | Status: AC
  Administered 2013-04-09: 1020 mg via INTRAVENOUS
  Filled 2013-04-09: qty 34

## 2013-04-09 NOTE — Progress Notes (Signed)
Hematology and Oncology Follow Up Visit  Rebecca Castillo [id number] 1976-01-08 38 y.o. 04/09/2013 8:39 AM [name], MDAron, Marciano Sequin, MD   Principle Diagnosis: Iron deficiency anemia  Prior Therapy: Feraheme 1,020 mg IV on 07/12/11  Current therapy: Watchful observation  Interim History:  Rebecca Castillo returns for routine follow-up for her anemia today. She was last seen in this office in 06/2011. She received IV iron in 06/2011 and tolerated it well without any infusion reaction. She reported having more energy and decreased ice craving following the iron. Today, she reports more fatigue. Denies CP, SOB, DOE. Has ice cravings. Reports menstrual cycles every 21 days. No other bleeding noted.  Medications: I have reviewed the patient's current medications.  Current Outpatient Prescriptions  Medication Sig Dispense Refill  . HYDROcodone-acetaminophen (NORCO/VICODIN) 5-325 MG per tablet Take 2 tablets by mouth every 4 (four) hours as needed.  15 tablet  0  . metroNIDAZOLE (FLAGYL) 500 MG tablet Take 1 tablet (500 mg total) by mouth 2 (two) times daily.  14 tablet  0  . naproxen (NAPROSYN) 500 MG tablet Take 1 tablet (500 mg total) by mouth 2 (two) times daily.  30 tablet  0   No current facility-administered medications for this visit.     Allergies:  Allergies  Allergen Reactions  . Oxycodone Other (See Comments)    Light headed    Past Medical History, Surgical history, Social history, and Family History were reviewed and updated.  Review of Systems: Constitutional:  Negative for fever, chills, night sweats, anorexia, weight loss, pain. Cardiovascular: no chest pain or dyspnea on exertion Respiratory: no cough, shortness of breath, or wheezing Neurological: no TIA or stroke symptoms Dermatological: negative ENT: negative Skin: Negative. Gastrointestinal: no abdominal pain, change in bowel habits, or black or bloody stools Genito-Urinary: no dysuria, trouble voiding, or  hematuria Hematological and Lymphatic: negative Breast: negative for breast lumps Musculoskeletal: negative Remaining ROS negative.  Physical Exam: Blood pressure 121/80, pulse 60, temperature 98.1 F (36.7 C), temperature source Oral, resp. rate 18, height 5\' 7"  (1.702 m), weight 167 lb 8 oz (75.978 kg), last menstrual period 03/07/2013, SpO2 96.00%. ECOG: 1 General appearance: alert, cooperative and no distress Head: Normocephalic, without obvious abnormality, atraumatic Neck: no adenopathy, no carotid bruit, no JVD, supple, symmetrical, trachea midline and thyroid not enlarged, symmetric, no tenderness/mass/nodules Lymph nodes: Cervical, supraclavicular, and axillary nodes normal. Heart:regular rate and rhythm, S1, S2 normal, no murmur, click, rub or gallop Lung:chest clear, no wheezing, rales, normal symmetric air entry, no tachypnea, retractions or cyanosis, Heart exam - S1, S2 normal, no murmur, no gallop, rate regular Abdomen: soft, non-tender, without masses or organomegaly EXT:no erythema, induration, or nodules   Lab Results: Lab Results  Component Value Date   WBC 5.8 04/09/2013   HGB 9.9* 04/09/2013   HCT 31.0* 04/09/2013   MCV 75.4* 04/09/2013   PLT 319 04/09/2013     Chemistry      Component Value Date/Time   NA 136 03/25/2013 0809   K 3.8 03/25/2013 0809   CL 106 03/25/2013 0809   CO2 24 03/25/2013 0809   BUN 15 03/25/2013 0809   CREATININE 0.7 03/25/2013 0809      Component Value Date/Time   CALCIUM 8.7 03/25/2013 0809   ALKPHOS 50 03/25/2013 0809   AST 14 03/25/2013 0809   ALT 14 03/25/2013 0809   BILITOT 0.5 03/25/2013 0809      Impression and Plan: This is a 38 year old female with the following  issues: 1. Iron deficiency anemia. Hgb is drifting down with a low MCV. She is symptomatic from her anemia. She has not tolerated oral iron in the past due to dyspepsia. Discussed giving Feraheme to replace iron today. Risks/benefits have been discussed and she is willing to  proceed. 2. Irregular menstrual cycles. She will continue to follow-up with her PCP for routine GYN care. 3. Follow-up. In 3 months.  Spent more than half the time coordinating care.    [place], [place] 2/20/20158:39 AM

## 2013-04-09 NOTE — Telephone Encounter (Signed)
Gave pt appt for lab and MD  for May 2015 pt sent to chemo room today for IV Iron

## 2013-04-09 NOTE — Patient Instructions (Signed)
(  Feraheme) Ferumoxytol injection What is this medicine? FERUMOXYTOL is an iron complex. Iron is used to make healthy red blood cells, which carry oxygen and nutrients throughout the body. This medicine is used to treat iron deficiency anemia in people with chronic kidney disease. This medicine may be used for other purposes; ask your health care provider or pharmacist if you have questions. COMMON BRAND NAME(S): Feraheme  What should I tell my health care provider before I take this medicine? They need to know if you have any of these conditions: -anemia not caused by low iron levels -high levels of iron in the blood -magnetic resonance imaging (MRI) test scheduled -an unusual or allergic reaction to iron, other medicines, foods, dyes, or preservatives -pregnant or trying to get pregnant -breast-feeding How should I use this medicine? This medicine is for injection into a vein. It is given by a health care professional in a hospital or clinic setting. Talk to your pediatrician regarding the use of this medicine in children. Special care may be needed. Overdosage: If you think you've taken too much of this medicine contact a poison control center or emergency room at once. Overdosage: If you think you have taken too much of this medicine contact a poison control center or emergency room at once. NOTE: This medicine is only for you. Do not share this medicine with others. What if I miss a dose? It is important not to miss your dose. Call your doctor or health care professional if you are unable to keep an appointment. What may interact with this medicine? This medicine may interact with the following medications: -other iron products This list may not describe all possible interactions. Give your health care provider a list of all the medicines, herbs, non-prescription drugs, or dietary supplements you use. Also tell them if you smoke, drink alcohol, or use illegal drugs. Some items may  interact with your medicine. What should I watch for while using this medicine? Visit your doctor or healthcare professional regularly. Tell your doctor or healthcare professional if your symptoms do not start to get better or if they get worse. You may need blood work done while you are taking this medicine. You may need to follow a special diet. Talk to your doctor. Foods that contain iron include: whole grains/cereals, dried fruits, beans, or peas, leafy green vegetables, and organ meats (liver, kidney). What side effects may I notice from receiving this medicine? Side effects that you should report to your doctor or health care professional as soon as possible: -allergic reactions like skin rash, itching or hives, swelling of the face, lips, or tongue -breathing problems -changes in blood pressure -feeling faint or lightheaded, falls -fever or chills -flushing, sweating, or hot feelings -swelling of the ankles or feet Side effects that usually do not require medical attention (Report these to your doctor or health care professional if they continue or are bothersome.): -diarrhea -headache -nausea, vomiting -stomach pain This list may not describe all possible side effects. Call your doctor for medical advice about side effects. You may report side effects to FDA at 1-800-FDA-1088. Where should I keep my medicine? This drug is given in a hospital or clinic and will not be stored at home. NOTE: This sheet is a summary. It may not cover all possible information. If you have questions about this medicine, talk to your doctor, pharmacist, or health care provider.  2014, Elsevier/Gold Standard. (2011-09-20 15:23:36)

## 2013-04-22 ENCOUNTER — Ambulatory Visit (INDEPENDENT_AMBULATORY_CARE_PROVIDER_SITE_OTHER): Payer: Medicaid Other | Admitting: General Surgery

## 2013-04-22 ENCOUNTER — Encounter (INDEPENDENT_AMBULATORY_CARE_PROVIDER_SITE_OTHER): Payer: Self-pay | Admitting: General Surgery

## 2013-04-22 VITALS — BP 112/80 | HR 72 | Temp 99.0°F | Resp 14 | Ht 67.5 in | Wt 169.0 lb

## 2013-04-22 DIAGNOSIS — K429 Umbilical hernia without obstruction or gangrene: Secondary | ICD-10-CM

## 2013-04-22 NOTE — Progress Notes (Signed)
Patient ID: Rebecca Castillo, female   DOB: [birth year], 38 y.o.   MRN: [id number]  Chief Complaint  Patient presents with  . New Evaluation    eval UMB hernia     HPI Rebecca Castillo is a 38 y.o. female.   HPI 38 yo AAF referred by Dr T. Deborra Medina for evaluation of umbilical hernia. The patient states that she has had a bulge at her umbilicus for at least 3-4 years. Over the past year or so she noticed that the area has slowly been getting larger in size.She denies any pain or discomfort. She denies any fever, chills, nausea, vomiting, diarrhea or constipation. Her prior abdominal surgery includes multiple C-sections. Past Medical History  Diagnosis Date  . 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 Anemia 07/08/2011  . Anemia 07/08/2011    Past Surgical History  Procedure Laterality Date  . Cesarean section      x 4  . Dental surgery      Family History  Problem Relation Age of Onset  . Hyperlipidemia      Family History  . Hypertension      Family history  . Stroke      Family History  . Heart disease Mother 96  . Cancer Maternal Grandmother     breast & lung    Social History History  Substance Use Topics  . Smoking status: Never Smoker   . Smokeless tobacco: Never Used  . Alcohol Use: No    Allergies  Allergen Reactions  . Oxycodone Other (See Comments)    Light headed    No current outpatient prescriptions on file.   No current facility-administered medications for this visit.    Review of Systems Review of Systems  Constitutional: Negative for fever, activity change, appetite change and unexpected weight change.       Has had severe n/v after anesthesia  HENT: Negative for nosebleeds and trouble swallowing.   Eyes: Negative for photophobia and visual disturbance.  Respiratory: Negative for chest tightness and shortness of breath.   Cardiovascular: Negative for chest pain and leg swelling.       Denies CP, SOB, orthopnea, PND, DOE  Genitourinary: Negative for  dysuria and difficulty urinating.  Musculoskeletal: Negative for arthralgias.  Skin: Negative for pallor and rash.  Neurological: Negative for dizziness, seizures, facial asymmetry and numbness.       Denies TIA and amaurosis fugax   Hematological: Negative for adenopathy. Does not bruise/bleed easily.       Has Iron deficiency anemia  Psychiatric/Behavioral: Negative for behavioral problems and agitation.    Blood pressure 112/80, pulse 72, temperature 99 F (37.2 C), temperature source Oral, resp. rate 14, height 5' 7.5" (1.715 m), weight 169 lb (76.658 kg), last menstrual period 03/07/2013.  Physical Exam Physical Exam  Vitals reviewed. Constitutional: She is oriented to person, place, and time. She appears well-developed and well-nourished. No distress.  HENT:  Head: Normocephalic and atraumatic.  Right Ear: External ear normal.  Left Ear: External ear normal.  Eyes: Conjunctivae are normal. No scleral icterus.  Neck: Normal range of motion. Neck supple. No tracheal deviation present. No thyromegaly present.  Cardiovascular: Normal rate and normal heart sounds.   Pulmonary/Chest: Effort normal and breath sounds normal. No stridor. No respiratory distress. She has no wheezes.  Abdominal: Soft. She exhibits no distension. There is no tenderness. There is no rebound and no guarding. A hernia is present. Hernia confirmed positive in the ventral area.  Circumferential umbilical tattoo. +belly button ring. Bulge along left side of umbilicus. Defect around 1.5cm. nontender  Musculoskeletal: She exhibits no edema and no tenderness.  Lymphadenopathy:    She has no cervical adenopathy.  Neurological: She is alert and oriented to person, place, and time. She exhibits normal muscle tone.  Skin: Skin is warm and dry. No rash noted. She is not diaphoretic. No erythema.  Psychiatric: She has a normal mood and affect. Her behavior is normal. Judgment and thought content normal.    Data  Reviewed Hematology note Dr Deborra Medina note  Assessment     umbilical hernia     Plan    We discussed the etiology of umbilical hernias. We discussed the signs and symptoms of incarceration and strangulation. The patient was given educational material. I also drew diagrams.  We discussed nonoperative and operative management. With respect to operative management, we discussed open repair   We discussed the risk and benefits of surgery including but not limited to bleeding, infection, injury to surrounding structures, hernia recurrence, mesh complications, hematoma/seroma formation,  blood clot formation, urinary retention, post operative ileus, general anesthesia risk, abdominal pain.  We discussed the importance of avoiding heavy lifting and straining for a period of 4- 6 weeks.  The patient has elected to proceed with OPEN REPAIR OF UMBILICAL HERNIA, POSSIBLE MESH.  The patient will meet with our surgery schedulers today.  Rebecca Ruff. Redmond Pulling, MD, FACS General, Bariatric, & Minimally Invasive Surgery [facility], [place]         [facility] M 04/22/2013, 9:40 AM

## 2013-04-22 NOTE — Patient Instructions (Signed)
Hernia A hernia occurs when an internal organ pushes out through a weak spot in the abdominal wall. Hernias most commonly occur in the groin and around the navel. Hernias often can be pushed back into place (reduced). Most hernias tend to get worse over time. Some abdominal hernias can get stuck in the opening (irreducible or incarcerated hernia) and cannot be reduced. An irreducible abdominal hernia which is tightly squeezed into the opening is at risk for impaired blood supply (strangulated hernia). A strangulated hernia is a medical emergency. Because of the risk for an irreducible or strangulated hernia, surgery may be recommended to repair a hernia. CAUSES   Heavy lifting.  Prolonged coughing.  Straining to have a bowel movement.  A cut (incision) made during an abdominal surgery. HOME CARE INSTRUCTIONS   Bed rest is not required. You may continue your normal activities.  Avoid lifting more than 10 pounds (4.5 kg) or straining.  Cough gently. If you are a smoker it is best to stop. Even the best hernia repair can break down with the continual strain of coughing. Even if you do not have your hernia repaired, a cough will continue to aggravate the problem.  Do not wear anything tight over your hernia. Do not try to keep it in with an outside bandage or truss. These can damage abdominal contents if they are trapped within the hernia sac.  Eat a normal diet.  Avoid constipation. Straining over long periods of time will increase hernia size and encourage breakdown of repairs. If you cannot do this with diet alone, stool softeners may be used. SEEK IMMEDIATE MEDICAL CARE IF:   You have a fever.  You develop increasing abdominal pain.  You feel nauseous or vomit.  Your hernia is stuck outside the abdomen, looks discolored, feels hard, or is tender.  You have any changes in your bowel habits or in the hernia that are unusual for you.  You have increased pain or swelling around the  hernia.  You cannot push the hernia back in place by applying gentle pressure while lying down. MAKE SURE YOU:   Understand these instructions.  Will watch your condition.  Will get help right away if you are not doing well or get worse. Document Released: 02/04/2005 Document Revised: 04/29/2011 Document Reviewed: 09/24/2007 [employer] Patient Information 2014 [employer].  

## 2013-05-19 DIAGNOSIS — K429 Umbilical hernia without obstruction or gangrene: Secondary | ICD-10-CM

## 2013-05-19 HISTORY — DX: Umbilical hernia without obstruction or gangrene: K42.9

## 2013-05-24 ENCOUNTER — Encounter (HOSPITAL_BASED_OUTPATIENT_CLINIC_OR_DEPARTMENT_OTHER): Payer: Self-pay | Admitting: *Deleted

## 2013-05-24 NOTE — Pre-Procedure Instructions (Signed)
History of anemia and last CBC reviewed by Dr. Al Corpus; pt. OK to come for surgery; no need for pre-op CBC.

## 2013-05-27 ENCOUNTER — Ambulatory Visit (HOSPITAL_BASED_OUTPATIENT_CLINIC_OR_DEPARTMENT_OTHER): Admission: RE | Admit: 2013-05-27 | Payer: Medicaid Other | Source: Ambulatory Visit | Admitting: General Surgery

## 2013-05-27 HISTORY — DX: Iron deficiency anemia, unspecified: D50.9

## 2013-05-27 HISTORY — DX: Umbilical hernia without obstruction or gangrene: K42.9

## 2013-05-27 HISTORY — DX: Nausea with vomiting, unspecified: R11.2

## 2013-05-27 HISTORY — DX: Other specified postprocedural states: Z98.890

## 2013-05-27 SURGERY — REPAIR, HERNIA, UMBILICAL, ADULT
Anesthesia: General

## 2013-06-10 ENCOUNTER — Encounter (INDEPENDENT_AMBULATORY_CARE_PROVIDER_SITE_OTHER): Payer: Medicaid Other | Admitting: General Surgery

## 2013-06-18 ENCOUNTER — Ambulatory Visit (INDEPENDENT_AMBULATORY_CARE_PROVIDER_SITE_OTHER): Payer: Medicaid Other | Admitting: Family Medicine

## 2013-06-18 ENCOUNTER — Encounter: Payer: Self-pay | Admitting: Family Medicine

## 2013-06-18 VITALS — BP 102/70 | HR 80 | Temp 98.3°F | Wt 167.5 lb

## 2013-06-18 DIAGNOSIS — M25569 Pain in unspecified knee: Secondary | ICD-10-CM | POA: Insufficient documentation

## 2013-06-18 NOTE — Patient Instructions (Signed)
Knee sleeve, ice, ibuprofen with food.  If not better in 1-2 weeks, then notify me.   Take care.  Glad to see you.

## 2013-06-18 NOTE — Assessment & Plan Note (Signed)
She is improved, could still have a soft tissue injury but there is no sign of sig failure.  xrays not likely to change mgmt at this point.   Would use a sleeve, relative rest, ice and ibuprofen in the meantime.  If needed we can image, if she doesn't continue to have improvement. She agrees.

## 2013-06-18 NOTE — Progress Notes (Signed)
Pre visit review using our clinic review tool, if applicable. No additional management support is needed unless otherwise documented below in the visit note.  2 weeks ago.  Was raining.  Went in the back yard.  She caught herself halfway down, R knee medially deviated.  Didn't go to ground.  Able to bear weight.  Knee was sore and bruised.  Iced the knee in the meantime.  Still a little swollen.  Occ popping sound with rotation/twisting.  Pain with squatting and kneeling.  L knee feels fine.    Meds, vitals, and allergies reviewed.   ROS: See HPI.  Otherwise, noncontributory.  nad R knee slightly puffy. Normal extension and full flexion but pain at terminal flexion.  Slightly ttp on the med/lateral joint line but no bruising now Patella not ttp, no pain with patellar translation ACL MCL LCL feel solid, no meniscal click.  Strength wnl

## 2013-07-07 ENCOUNTER — Ambulatory Visit: Payer: Medicaid Other | Admitting: Oncology

## 2013-07-07 ENCOUNTER — Other Ambulatory Visit: Payer: Medicaid Other

## 2013-07-19 ENCOUNTER — Ambulatory Visit: Payer: Medicaid Other | Admitting: Family Medicine

## 2013-09-06 ENCOUNTER — Telehealth: Payer: Self-pay | Admitting: Oncology

## 2013-09-06 NOTE — Telephone Encounter (Signed)
Pt cld lft msg I cld bk and lft msg for pt confirming updated schedule and that I would mail schedule out to pt.Marland Kitchen..KJ

## 2013-09-16 ENCOUNTER — Ambulatory Visit (INDEPENDENT_AMBULATORY_CARE_PROVIDER_SITE_OTHER): Payer: Medicaid Other | Admitting: Internal Medicine

## 2013-09-16 ENCOUNTER — Encounter: Payer: Self-pay | Admitting: Internal Medicine

## 2013-09-16 VITALS — BP 110/74 | HR 78 | Temp 97.9°F | Wt 164.0 lb

## 2013-09-16 DIAGNOSIS — R8290 Unspecified abnormal findings in urine: Secondary | ICD-10-CM

## 2013-09-16 DIAGNOSIS — R82998 Other abnormal findings in urine: Secondary | ICD-10-CM

## 2013-09-16 LAB — POCT URINALYSIS DIPSTICK
Glucose, UA: NEGATIVE
Ketones, UA: NEGATIVE
NITRITE UA: NEGATIVE
SPEC GRAV UA: 1.015
Urobilinogen, UA: 0.2
pH, UA: 7

## 2013-09-16 MED ORDER — NITROFURANTOIN MONOHYD MACRO 100 MG PO CAPS: 100.0000 mg | ORAL_CAPSULE | Freq: Two times a day (BID) | ORAL | Status: DC

## 2013-09-16 NOTE — Addendum Note (Signed)
Addended by: Lurlean Nanny on: 09/16/2013 01:39 PM   Modules accepted: Orders

## 2013-09-16 NOTE — Progress Notes (Signed)
Pre visit review using our clinic review tool, if applicable. No additional management support is needed unless otherwise documented below in the visit note. 

## 2013-09-16 NOTE — Progress Notes (Addendum)
HPI  Pt presents to the clinic today with c/o dark urine with odor. She reports this started 2-3 weeks ago. She denies urgency, frequency, or pain with urination. She has not had a fever. She denies vaginal irritation or discharge.   Review of Systems  Past Medical History  Diagnosis Date  . PONV (postoperative nausea and vomiting)   . Iron deficiency anemia     received Feraheme infusion 04/09/2013  . Umbilical hernia 0/2637    Family History  Problem Relation Age of Onset  . Cancer Maternal Grandmother     breast, lung  . Heart disease Mother     History   Social History  . Marital Status: Single    Spouse Name: N/A    Number of Children: N/A  . Years of Education: N/A   Occupational History  . Not on file.   Social History Main Topics  . Smoking status: Never Smoker   . Smokeless tobacco: Never Used  . Alcohol Use: Yes     Comment: occasionally  . Drug Use: No  . Sexual Activity: Not on file   Other Topics Concern  . Not on file   Social History Narrative  . No narrative on file    Allergies  Allergen Reactions  . Oxycodone Other (See Comments)    DIZZINESS    Constitutional: Denies fever, malaise, fatigue, headache or abrupt weight changes.   GU: Pt reports dark urine and odor. Denies urgency, frequency, burning sensation, blood in urine or discharge. Skin: Denies redness, rashes, lesions or ulcercations.   No other specific complaints in a complete review of systems (except as listed in HPI above).    Objective:   Physical Exam  BP 110/74  Pulse 78  Temp(Src) 97.9 F (36.6 C) (Oral)  Wt 164 lb (74.39 kg)  SpO2 99%  LMP 08/31/2013  Wt Readings from Last 3 Encounters:  09/16/13 164 lb (74.39 kg)  06/18/13 167 lb 8 oz (75.978 kg)  05/24/13 165 lb (74.844 kg)    General: Appears her stated age, well developed, well nourished in NAD. Cardiovascular: Normal rate and rhythm. S1,S2 noted.  No murmur, rubs or gallops noted. No JVD or BLE  edema. No carotid bruits noted. Pulmonary/Chest: Normal effort and positive vesicular breath sounds. No respiratory distress. No wheezes, rales or ronchi noted.  Abdomen: Soft. Normal bowel sounds, no bruits noted. No distention or masses noted. Liver, spleen and kidneys non palpable. Tender to palpation over the bladder area. No CVA tenderness.      Assessment & Plan:   Abnormal urine odor, dark urine secondary to ? UTI  Urinalysis: mod blood, small leuks Will send urine culture eRx sent if for Macrobid 100 mg BID x 5 days OK to take AZO OTC Drink plenty of fluids  RTC as needed or if symptoms persist.

## 2013-09-16 NOTE — Patient Instructions (Addendum)

## 2013-09-18 LAB — URINE CULTURE: Colony Count: >=100000

## 2013-10-01 ENCOUNTER — Ambulatory Visit (HOSPITAL_BASED_OUTPATIENT_CLINIC_OR_DEPARTMENT_OTHER): Payer: Medicaid Other | Admitting: Oncology

## 2013-10-01 ENCOUNTER — Other Ambulatory Visit (HOSPITAL_BASED_OUTPATIENT_CLINIC_OR_DEPARTMENT_OTHER): Payer: Self-pay

## 2013-10-01 ENCOUNTER — Ambulatory Visit (HOSPITAL_BASED_OUTPATIENT_CLINIC_OR_DEPARTMENT_OTHER): Payer: Self-pay

## 2013-10-01 VITALS — BP 105/68 | HR 61 | Temp 98.0°F | Resp 18

## 2013-10-01 VITALS — BP 115/75 | HR 60 | Temp 98.7°F | Resp 18 | Ht 67.0 in | Wt 163.5 lb

## 2013-10-01 DIAGNOSIS — D509 Iron deficiency anemia, unspecified: Secondary | ICD-10-CM

## 2013-10-01 LAB — CBC WITH DIFFERENTIAL/PLATELET
BASO%: 0.6 % (ref 0.0–2.0)
Basophils Absolute: 0 10*3/uL (ref 0.0–0.1)
EOS%: 2.1 % (ref 0.0–7.0)
Eosinophils Absolute: 0.1 10*3/uL (ref 0.0–0.5)
HCT: 35.4 % (ref 34.8–46.6)
HGB: 11.5 g/dL — ABNORMAL LOW (ref 11.6–15.9)
LYMPH#: 1.9 10*3/uL (ref 0.9–3.3)
LYMPH%: 31.7 % (ref 14.0–49.7)
MCH: 28.7 pg (ref 25.1–34.0)
MCHC: 32.5 g/dL (ref 31.5–36.0)
MCV: 88.2 fL (ref 79.5–101.0)
MONO#: 0.5 10*3/uL (ref 0.1–0.9)
MONO%: 8.5 % (ref 0.0–14.0)
NEUT#: 3.4 10*3/uL (ref 1.5–6.5)
NEUT%: 57.1 % (ref 38.4–76.8)
Platelets: 274 10*3/uL (ref 145–400)
RBC: 4.02 10*6/uL (ref 3.70–5.45)
RDW: 12.9 % (ref 11.2–14.5)
WBC: 6 10*3/uL (ref 3.9–10.3)

## 2013-10-01 LAB — IRON AND TIBC CHCC
%SAT: 14 % — AB (ref 21–57)
IRON: 42 ug/dL (ref 41–142)
TIBC: 310 ug/dL (ref 236–444)
UIBC: 268 ug/dL (ref 120–384)

## 2013-10-01 LAB — FERRITIN CHCC: Ferritin: 20 ng/ml (ref 9–269)

## 2013-10-01 MED ORDER — SODIUM CHLORIDE 0.9 % IV SOLN
1020.0000 mg | Freq: Once | INTRAVENOUS | Status: AC
  Administered 2013-10-01: 1020 mg via INTRAVENOUS
  Filled 2013-10-01: qty 34

## 2013-10-01 MED ORDER — SODIUM CHLORIDE 0.9 % IV SOLN
Freq: Once | INTRAVENOUS | Status: AC
  Administered 2013-10-01: 10:00:00 via INTRAVENOUS

## 2013-10-01 NOTE — Progress Notes (Signed)
Pt tolerated Feraheme treatment without difficulty. Monitored for 30 minutes post transfusion. Vitals stable, no complaints. Pt discharged to lobby with family member.

## 2013-10-01 NOTE — Patient Instructions (Signed)

## 2013-10-01 NOTE — Progress Notes (Signed)
Hematology and Oncology Follow Up Visit  Rebecca Castillo [id number] 1975-06-29 38 y.o. 10/01/2013 8:43 AM Rebecca Castillo, MDAron, Rebecca Sequin, MD   Principle Diagnosis: 38 year old woman with Iron deficiency anemia diagnosed in May of 2013.  Prior Therapy: Feraheme 1,020 mg IV on 07/12/11 and repeated in February of 2015.  Current therapy: Watchful observation  Interim History:  Rebecca Castillo returns for routine follow-up today. Since her last visit, she tolerated IV iron without any complications.. She received IV iron in 06/2011 and hand in February of 2015  without any infusion reaction. She reported having more energy and decreased ice craving following the iron. Today, she reports less fatigue. Denies CP, SOB, DOE. Has no ice cravings. Reports menstrual cycles every 21 days there is still having at this time. She has not reported any headaches or blurry vision or syncope. Does not report any nausea or vomiting or abdominal pain. Does not report any hematochezia or melena. Rest of the review of systems unremarkable.  Medications: I have reviewed the patient's current medications.  Current Outpatient Prescriptions  Medication Sig Dispense Refill  . nitrofurantoin, macrocrystal-monohydrate, (MACROBID) 100 MG capsule Take 1 capsule (100 mg total) by mouth 2 (two) times daily.  10 capsule  0   No current facility-administered medications for this visit.     Allergies:  Allergies  Allergen Reactions  . Oxycodone Other (See Comments)    DIZZINESS    Past Medical History, Surgical history, Social history, and Family History were reviewed and updated.   Physical Exam: Blood pressure 115/75, pulse 60, temperature 98.7 F (37.1 C), temperature source Oral, resp. rate 18, height 5\' 7"  (1.702 m), weight 163 lb 8 oz (74.163 kg), last menstrual period 08/31/2013. ECOG: 1 General appearance: alert and cooperative Head: Normocephalic, without obvious abnormality, atraumatic Neck: no adenopathy Lymph  nodes: Cervical, supraclavicular, and axillary nodes normal. Heart:regular rate and rhythm, S1, S2 normal, no murmur, click, rub or gallop Lung:chest clear, no wheezing, rales, normal symmetric air entry, no tachypnea, retractions or cyanosis, Heart exam - S1, S2 normal, no murmur, no gallop, rate regular Abdomen: soft, non-tender, without masses or organomegaly EXT:no erythema, induration, or nodules   Lab Results: Lab Results  Component Value Date   WBC 6.0 10/01/2013   HGB 11.5* 10/01/2013   HCT 35.4 10/01/2013   MCV 88.2 10/01/2013   PLT 274 10/01/2013     Chemistry      Component Value Date/Time   NA 138 04/09/2013 0818   NA 136 03/25/2013 0809   K 3.8 04/09/2013 0818   K 3.8 03/25/2013 0809   CL 106 03/25/2013 0809   CO2 23 04/09/2013 0818   CO2 24 03/25/2013 0809   BUN 14.2 04/09/2013 0818   BUN 15 03/25/2013 0809   CREATININE 0.7 04/09/2013 0818   CREATININE 0.7 03/25/2013 0809      Component Value Date/Time   CALCIUM 8.6 04/09/2013 0818   CALCIUM 8.7 03/25/2013 0809   ALKPHOS 55 04/09/2013 0818   ALKPHOS 50 03/25/2013 0809   AST 17 04/09/2013 0818   AST 14 03/25/2013 0809   ALT 9 04/09/2013 0818   ALT 14 03/25/2013 0809   BILITOT 0.34 04/09/2013 0818   BILITOT 0.5 03/25/2013 0809      Impression and Plan:  This is a 38 year old female with the following issues: 1. Iron deficiency anemia. Hgb is improved from February of 2015 but not completely normal. I am rechecking her iron stores today and if she is still deficient  I will like to give her another IV iron infusion. As well as those are completely occluded, no iron will be given at that time. She is agreeable with this plan.  2. Irregular menstrual cycles. She will continue to follow-up with her PCP for routine GYN care.  3. Follow-up. In 6 months.    Rebecca Castillo 8/14/20158:43 AM

## 2013-10-04 ENCOUNTER — Telehealth: Payer: Self-pay | Admitting: Oncology

## 2013-10-04 NOTE — Telephone Encounter (Signed)
S/w pt gave appt 04/12/14. Per pof 8/14, pt needs iv iron the following wk. Pt stated she got iron when she was here on 8/14. No other appts scheduled.

## 2014-04-12 ENCOUNTER — Other Ambulatory Visit: Payer: Self-pay

## 2014-04-12 ENCOUNTER — Ambulatory Visit: Payer: Self-pay | Admitting: Oncology

## 2014-04-12 ENCOUNTER — Encounter: Payer: Self-pay | Admitting: *Deleted

## 2014-04-12 NOTE — Progress Notes (Signed)
Failure to keep appt today. Called left message for patient to call me.

## 2014-04-28 ENCOUNTER — Other Ambulatory Visit: Payer: Self-pay | Admitting: Family Medicine

## 2014-04-28 DIAGNOSIS — Z0000 Encounter for general adult medical examination without abnormal findings: Secondary | ICD-10-CM

## 2014-04-29 ENCOUNTER — Encounter: Payer: Self-pay | Admitting: Primary Care

## 2014-04-29 ENCOUNTER — Ambulatory Visit (INDEPENDENT_AMBULATORY_CARE_PROVIDER_SITE_OTHER): Payer: 59 | Admitting: Primary Care

## 2014-04-29 ENCOUNTER — Other Ambulatory Visit (INDEPENDENT_AMBULATORY_CARE_PROVIDER_SITE_OTHER): Payer: 59

## 2014-04-29 VITALS — BP 118/84 | HR 70 | Temp 97.9°F | Ht 67.0 in | Wt 170.8 lb

## 2014-04-29 DIAGNOSIS — R8290 Unspecified abnormal findings in urine: Secondary | ICD-10-CM

## 2014-04-29 DIAGNOSIS — Z0000 Encounter for general adult medical examination without abnormal findings: Secondary | ICD-10-CM

## 2014-04-29 DIAGNOSIS — R350 Frequency of micturition: Secondary | ICD-10-CM

## 2014-04-29 LAB — POCT URINALYSIS DIPSTICK
BILIRUBIN UA: NEGATIVE
Glucose, UA: NEGATIVE
KETONES UA: NEGATIVE
Leukocytes, UA: NEGATIVE
Nitrite, UA: NEGATIVE
Protein, UA: NEGATIVE
RBC UA: NEGATIVE
Spec Grav, UA: 1.025
Urobilinogen, UA: 4
pH, UA: 6

## 2014-04-29 LAB — LIPID PANEL
CHOL/HDL RATIO: 4
Cholesterol: 150 mg/dL (ref 0–200)
HDL: 35.6 mg/dL — AB (ref >39.00)
LDL Cholesterol: 102 mg/dL — ABNORMAL HIGH (ref 0–99)
NonHDL: 114.4
Triglycerides: 60 mg/dL (ref 0.0–149.0)
VLDL: 12 mg/dL (ref 0.0–40.0)

## 2014-04-29 LAB — COMPREHENSIVE METABOLIC PANEL
ALBUMIN: 4.1 g/dL (ref 3.5–5.2)
ALT: 17 U/L (ref 0–35)
AST: 15 U/L (ref 0–37)
Alkaline Phosphatase: 56 U/L (ref 39–117)
BUN: 17 mg/dL (ref 6–23)
CALCIUM: 8.9 mg/dL (ref 8.4–10.5)
CHLORIDE: 105 meq/L (ref 96–112)
CO2: 27 mEq/L (ref 19–32)
Creatinine, Ser: 0.8 mg/dL (ref 0.40–1.20)
GFR: 102.1 mL/min (ref >60.00)
GLUCOSE: 95 mg/dL (ref 70–99)
POTASSIUM: 3.8 meq/L (ref 3.5–5.1)
Sodium: 137 mEq/L (ref 135–145)
TOTAL PROTEIN: 7 g/dL (ref 6.0–8.3)
Total Bilirubin: 0.3 mg/dL (ref 0.2–1.2)

## 2014-04-29 LAB — CBC WITH DIFFERENTIAL/PLATELET
BASOS ABS: 0 10*3/uL (ref 0.0–0.1)
Basophils Relative: 0.4 % (ref 0.0–3.0)
EOS PCT: 1 % (ref 0.0–5.0)
Eosinophils Absolute: 0.1 10*3/uL (ref 0.0–0.7)
HEMATOCRIT: 35.3 % — AB (ref 36.0–46.0)
HEMOGLOBIN: 12 g/dL (ref 12.0–15.0)
Lymphocytes Relative: 30.1 % (ref 12.0–46.0)
Lymphs Abs: 2.2 10*3/uL (ref 0.7–4.0)
MCHC: 34 g/dL (ref 30.0–36.0)
MCV: 85.3 fl (ref 78.0–100.0)
MONOS PCT: 7.2 % (ref 3.0–12.0)
Monocytes Absolute: 0.5 10*3/uL (ref 0.1–1.0)
NEUTROS ABS: 4.4 10*3/uL (ref 1.4–7.7)
Neutrophils Relative %: 61.3 % (ref 43.0–77.0)
Platelets: 313 10*3/uL (ref 150.0–400.0)
RBC: 4.14 Mil/uL (ref 3.87–5.11)
RDW: 12.5 % (ref 11.5–15.5)
WBC: 7.2 10*3/uL (ref 4.0–10.5)

## 2014-04-29 LAB — TSH: TSH: 1.03 u[IU]/mL (ref 0.35–4.50)

## 2014-04-29 NOTE — Progress Notes (Signed)
Subjective:    Patient ID: Rebecca Castillo, female    DOB: 1975-08-15, 39 y.o.   MRN: [id number]  HPI  Rebecca Castillo is a 39 year old female who presents today with a chief complaint of foul smelling urine for approximately six weeks that she notices after drinking bottled water from [employer]. This originally began 1 year ago and has been intermittently returning. She denies dysuria, frequency, hematuria, vaginal discharge, vaginal bleeding, vaginal odor. The foul smell will linger in the bathroom after she flushes the toilet. She mostly drinks sweet tea and soda with 1 bottle of water weekly due to the fear of the odor. She does report her urine to be concentrated.  Prior to a year ago she was drinking about 8 oz of water daily without any existence of this foul odor. She's tried drinking cranberry juice, cranberry pills, and AZO without relief.   Review of Systems  Constitutional: Negative for fever.  HENT: Negative for rhinorrhea.   Respiratory: Negative for cough.   Gastrointestinal: Negative for nausea, vomiting and abdominal pain.  Genitourinary: Positive for urgency. Negative for dysuria, frequency, hematuria, decreased urine volume, vaginal bleeding, vaginal discharge, difficulty urinating, vaginal pain and pelvic pain.  Neurological: Negative for headaches.  Hematological: Negative for adenopathy.       Past Medical History  Diagnosis Date  . PONV (postoperative nausea and vomiting)   . Iron deficiency anemia     received Feraheme infusion 04/09/2013  . Umbilical hernia 08/1060    History   Social History  . Marital Status: Single    Spouse Name: N/A  . Number of Children: N/A  . Years of Education: N/A   Occupational History  . Not on file.   Social History Main Topics  . Smoking status: Never Smoker   . Smokeless tobacco: Never Used  . Alcohol Use: Yes     Comment: occasionally  . Drug Use: No  . Sexual Activity: Not on file   Other Topics Concern  . Not on  file   Social History Narrative    Past Surgical History  Procedure Laterality Date  . Cesarean section      x 4  . Multiple tooth extractions      Family History  Problem Relation Age of Onset  . Cancer Maternal Grandmother     breast, lung  . Heart disease Mother     Allergies  Allergen Reactions  . Oxycodone Other (See Comments)    DIZZINESS    Current Outpatient Prescriptions on File Prior to Visit  Medication Sig Dispense Refill  . nitrofurantoin, macrocrystal-monohydrate, (MACROBID) 100 MG capsule Take 1 capsule (100 mg total) by mouth 2 (two) times daily. (Patient not taking: Reported on 04/29/2014) 10 capsule 0   No current facility-administered medications on file prior to visit.    BP 118/84 mmHg  Pulse 70  Temp(Src) 97.9 F (36.6 C) (Oral)  Ht 5\' 7"  (1.702 m)  Wt 170 lb 12.8 oz (77.474 kg)  BMI 26.74 kg/m2  SpO2 97%  LMP 04/11/2014    Objective:   Physical Exam  Constitutional: She is oriented to person, place, and time. She appears well-developed.  HENT:  Head: Normocephalic.  Cardiovascular: Normal rate and regular rhythm.   Pulmonary/Chest: Effort normal and breath sounds normal.  Abdominal: Soft. Bowel sounds are normal. There is no tenderness.  Neurological: She is alert and oriented to person, place, and time.  Skin: Skin is warm and dry.  Psychiatric: She has a  normal mood and affect.          Assessment & Plan:  UA Dipstick negative for infection.

## 2014-04-29 NOTE — Patient Instructions (Signed)
Your urine showed no infection or signs of kidney damage. Try to limit sweet tea and sodas and increase water intake. Switch your water source from [employer] to see if any change. Complete lab work as previously scheduled and keep appointment with Dr. Deborra Medina next week. Take care and have a good weekend!

## 2014-04-29 NOTE — Assessment & Plan Note (Addendum)
UA negative for infection, blood, protein. No vaginal complaints.  Encouraged increasing her intake of water and changing her water source. Obtain lab work that was previously ordered including CMP. Follow up with PCP as scheduled.

## 2014-04-29 NOTE — Progress Notes (Signed)
Pre visit review using our clinic review tool, if applicable. No additional management support is needed unless otherwise documented below in the visit note. 

## 2014-05-04 ENCOUNTER — Other Ambulatory Visit (HOSPITAL_COMMUNITY)
Admission: RE | Admit: 2014-05-04 | Discharge: 2014-05-04 | Disposition: A | Discharge status: Home / Self Care | Payer: 59 | Source: Ambulatory Visit | Attending: Family Medicine | Admitting: Family Medicine

## 2014-05-04 ENCOUNTER — Encounter: Payer: Self-pay | Admitting: Family Medicine

## 2014-05-04 ENCOUNTER — Encounter: Payer: Self-pay | Admitting: *Deleted

## 2014-05-04 ENCOUNTER — Ambulatory Visit (INDEPENDENT_AMBULATORY_CARE_PROVIDER_SITE_OTHER): Payer: 59 | Admitting: Family Medicine

## 2014-05-04 VITALS — BP 124/78 | HR 66 | Temp 98.2°F | Ht 67.0 in | Wt 170.2 lb

## 2014-05-04 DIAGNOSIS — Z01419 Encounter for gynecological examination (general) (routine) without abnormal findings: Secondary | ICD-10-CM | POA: Diagnosis not present

## 2014-05-04 DIAGNOSIS — K420 Umbilical hernia with obstruction, without gangrene: Secondary | ICD-10-CM

## 2014-05-04 DIAGNOSIS — N760 Acute vaginitis: Secondary | ICD-10-CM | POA: Insufficient documentation

## 2014-05-04 DIAGNOSIS — Z113 Encounter for screening for infections with a predominantly sexual mode of transmission: Secondary | ICD-10-CM | POA: Diagnosis present

## 2014-05-04 DIAGNOSIS — Z1239 Encounter for other screening for malignant neoplasm of breast: Secondary | ICD-10-CM

## 2014-05-04 DIAGNOSIS — R8290 Unspecified abnormal findings in urine: Secondary | ICD-10-CM

## 2014-05-04 DIAGNOSIS — Z1151 Encounter for screening for human papillomavirus (HPV): Secondary | ICD-10-CM | POA: Diagnosis present

## 2014-05-04 DIAGNOSIS — Z0000 Encounter for general adult medical examination without abnormal findings: Secondary | ICD-10-CM

## 2014-05-04 NOTE — Patient Instructions (Signed)
Great to see you. We will call you with your results from today.  Please call to set up your mammogram.

## 2014-05-04 NOTE — Progress Notes (Signed)
Pre visit review using our clinic review tool, if applicable. No additional management support is needed unless otherwise documented below in the visit note. 

## 2014-05-04 NOTE — Assessment & Plan Note (Signed)
Deteriorated. She plans on getting it repaired now that she has insurance.

## 2014-05-04 NOTE — Assessment & Plan Note (Signed)
Will rule out bacterial vaginosis/STDs today.

## 2014-05-04 NOTE — Assessment & Plan Note (Signed)
Reviewed preventive care protocols, scheduled due services, and updated immunizations Discussed nutrition, exercise, diet, and healthy lifestyle. Declines flu vaccine.  Mammogram ordered.  Discussed USPSTF recommendations of cervical cancer screening.  She is aware that interval of 3 years is recommended but pt would prefer to have pap smear done today.

## 2014-05-04 NOTE — Progress Notes (Signed)
Subjective:   Patient ID: Rebecca Castillo, female    DOB: 07/09/1975, 39 y.o.   MRN: [id number]  Rebecca Castillo is a pleasant 39 y.o. year old female who presents to clinic today with Annual Exam  on 05/04/2014  HPI:  Came in 5 days ago for foul smelling urine- saw Alma Friendly, UA neg- advised to drink more water.  She has been drinking more water since but still having foul smelling urine.  Denies vaginal discharge or dysuria. Symptoms have been intermittent for past 1.5 months.  Well woman- sexually active with one partner. S/p BTL. Last pap smear 04/01/13- done by me. No h/o abnormal pap smears. tdap 5/143/13  Has never had a mammogram.  No family h/o breast, uterine or cervical CA.  Anemia- was seeing Dr. Alen Blew- has needed IV iron in past. Feeling more fatigued. Lab Results  Component Value Date   WBC 7.2 04/29/2014   HGB 12.0 04/29/2014   HCT 35.3* 04/29/2014   MCV 85.3 04/29/2014   PLT 313.0 85/63/1497   Umbilical hernia- not hurting but getting larger.  She does want to get it repaired- now that she has new insurance, she will call the surgeon again.  Lab Results  Component Value Date   CHOL 150 04/29/2014   HDL 35.60* 04/29/2014   LDLCALC 102* 04/29/2014   TRIG 60.0 04/29/2014   CHOLHDL 4 04/29/2014   Lab Results  Component Value Date   CREATININE 0.80 04/29/2014   Lab Results  Component Value Date   TSH 1.03 04/29/2014   Lab Results  Component Value Date   ALT 17 04/29/2014   AST 15 04/29/2014   ALKPHOS 56 04/29/2014   BILITOT 0.3 04/29/2014   Lab Results  Component Value Date   NA 137 04/29/2014   K 3.8 04/29/2014   CL 105 04/29/2014   CO2 27 04/29/2014      Patient Active Problem List   Diagnosis Date Noted  . Foul smelling urine 04/29/2014  . Encounter for routine gynecological examination 04/01/2013  . Arthritis 04/01/2013  . Family history of early CAD 07/01/2011  . Umbilical hernia 02/63/7858  . Routine general medical  examination at a health care facility 06/20/2011  . UNSPECIFIED GENITAL HERPES 03/19/2010  . ANEMIA, IRON DEFICIENCY 03/19/2010   Past Medical History  Diagnosis Date  . PONV (postoperative nausea and vomiting)   . Iron deficiency anemia     received Feraheme infusion 04/09/2013  . Umbilical hernia 09/5025   Past Surgical History  Procedure Laterality Date  . Cesarean section      x 4  . Multiple tooth extractions     History  Substance Use Topics  . Smoking status: Never Smoker   . Smokeless tobacco: Never Used  . Alcohol Use: Yes     Comment: occasionally   Family History  Problem Relation Age of Onset  . Cancer Maternal Grandmother     breast, lung  . Heart disease Mother    Allergies  Allergen Reactions  . Oxycodone Other (See Comments)    DIZZINESS   No current outpatient prescriptions on file prior to visit.   No current facility-administered medications on file prior to visit.   The PMH, PSH, Social History, Family History, Medications, and allergies have been reviewed in [facility] and have been updated if relevant.   Review of Systems  Constitutional: Positive for fatigue. Negative for fever and appetite change.  Eyes: Negative.   Respiratory: Negative.   Cardiovascular: Negative.   Gastrointestinal:  Negative.   Endocrine: Negative.   Genitourinary: Negative.   Musculoskeletal: Negative.   Skin: Negative.   Allergic/Immunologic: Negative.   Neurological: Negative.   Hematological: Negative.   Psychiatric/Behavioral: Negative.   All other systems reviewed and are negative.      Objective:    BP 124/78 mmHg  Pulse 66  Temp(Src) 98.2 F (36.8 C) (Oral)  Ht 5\' 7"  (1.702 m)  Wt 170 lb 4 oz (77.225 kg)  BMI 26.66 kg/m2  SpO2 97%  LMP 04/11/2014   Physical Exam   General:  Well-developed,well-nourished,in no acute distress; alert,appropriate and cooperative throughout examination Head:  normocephalic and atraumatic.   Eyes:  vision grossly  intact, pupils equal, pupils round, and pupils reactive to light.   Ears:  R ear normal and L ear normal.   Nose:  no external deformity.   Mouth:  good dentition.   Neck:  No deformities, masses, or tenderness noted. Breasts:  No mass, nodules, thickening, tenderness, bulging, retraction, inflamation, nipple discharge or skin changes noted.   Lungs:  Normal respiratory effort, chest expands symmetrically. Lungs are clear to auscultation, no crackles or wheezes. Heart:  Normal rate and regular rhythm. S1 and S2 normal without gallop, murmur, click, rub or other extra sounds. Abdomen:  Bowel sounds positive,abdomen soft and non-tender without masses Umbilical hernia Rectal:  no external abnormalities.   Genitalia:  Pelvic Exam:        External: normal female genitalia without lesions or masses        Vagina: normal without lesions or masses        Cervix: normal without lesions or masses        Adnexa: normal bimanual exam without masses or fullness        Uterus: normal by palpation        Pap smear: performed Msk:  No deformity or scoliosis noted of thoracic or lumbar spine.   Extremities:  No clubbing, cyanosis, edema, or deformity noted with normal full range of motion of all joints.   Neurologic:  alert & oriented X3 and gait normal.   Skin:  Intact without suspicious lesions or rashes Cervical Nodes:  No lymphadenopathy noted Axillary Nodes:  No palpable lymphadenopathy Psych:  Cognition and judgment appear intact. Alert and cooperative with normal attention span and concentration. No apparent delusions, illusions, hallucinations       Assessment & Plan:   Routine general medical examination at a health care facility No Follow-up on file.

## 2014-05-05 LAB — CYTOLOGY - PAP

## 2014-05-06 LAB — CERVICOVAGINAL ANCILLARY ONLY
BACTERIAL VAGINITIS: POSITIVE — AB
BACTERIAL VAGINITIS: POSITIVE — AB
Bacterial vaginitis: POSITIVE — AB
Bacterial vaginitis: POSITIVE — AB
Bacterial vaginitis: POSITIVE — AB
Candida vaginitis: NEGATIVE

## 2014-05-09 ENCOUNTER — Other Ambulatory Visit: Payer: Self-pay | Admitting: Family Medicine

## 2014-05-09 DIAGNOSIS — B9689 Other specified bacterial agents as the cause of diseases classified elsewhere: Secondary | ICD-10-CM

## 2014-05-09 DIAGNOSIS — N760 Acute vaginitis: Secondary | ICD-10-CM

## 2014-05-09 LAB — CERVICOVAGINAL ANCILLARY ONLY: HERPES (WINDOWPATH): NEGATIVE

## 2014-05-09 MED ORDER — METRONIDAZOLE 500 MG PO TABS: 500.0000 mg | ORAL_TABLET | Freq: Two times a day (BID) | ORAL | Status: DC

## 2014-05-09 NOTE — Addendum Note (Signed)
Addended by: Modena Nunnery on: 05/09/2014 09:57 AM   Modules accepted: Orders

## 2014-06-06 ENCOUNTER — Ambulatory Visit
Admission: RE | Admit: 2014-06-06 | Discharge: 2014-06-06 | Disposition: A | Discharge status: Home / Self Care | Payer: 59 | Source: Ambulatory Visit | Attending: Family Medicine | Admitting: Family Medicine

## 2014-06-06 DIAGNOSIS — Z1239 Encounter for other screening for malignant neoplasm of breast: Secondary | ICD-10-CM

## 2014-08-11 ENCOUNTER — Telehealth: Payer: Self-pay

## 2014-08-11 NOTE — Telephone Encounter (Signed)
Ok to call in xanax- 0.25 mg tab- 1-2 tab 30 minutes prior to flight.  #6 with no refills.

## 2014-08-11 NOTE — Telephone Encounter (Signed)
Pt left v/m; pt will be flying to [place] at 7:45 AM on 08/12/14 and pt request med for anxiety while flying. Pt request cb.[facility] [place].

## 2014-08-12 NOTE — Telephone Encounter (Signed)
Message was not received before the end of day and pts flight left prior to

## 2014-11-21 IMAGING — CR DG CHEST 2V
2 series · 2 of 2 positions shown · non-contrast
Comparison: None.

CLINICAL DATA: Wheezing and cough

CHEST - 2 VIEW

[w chest pa]
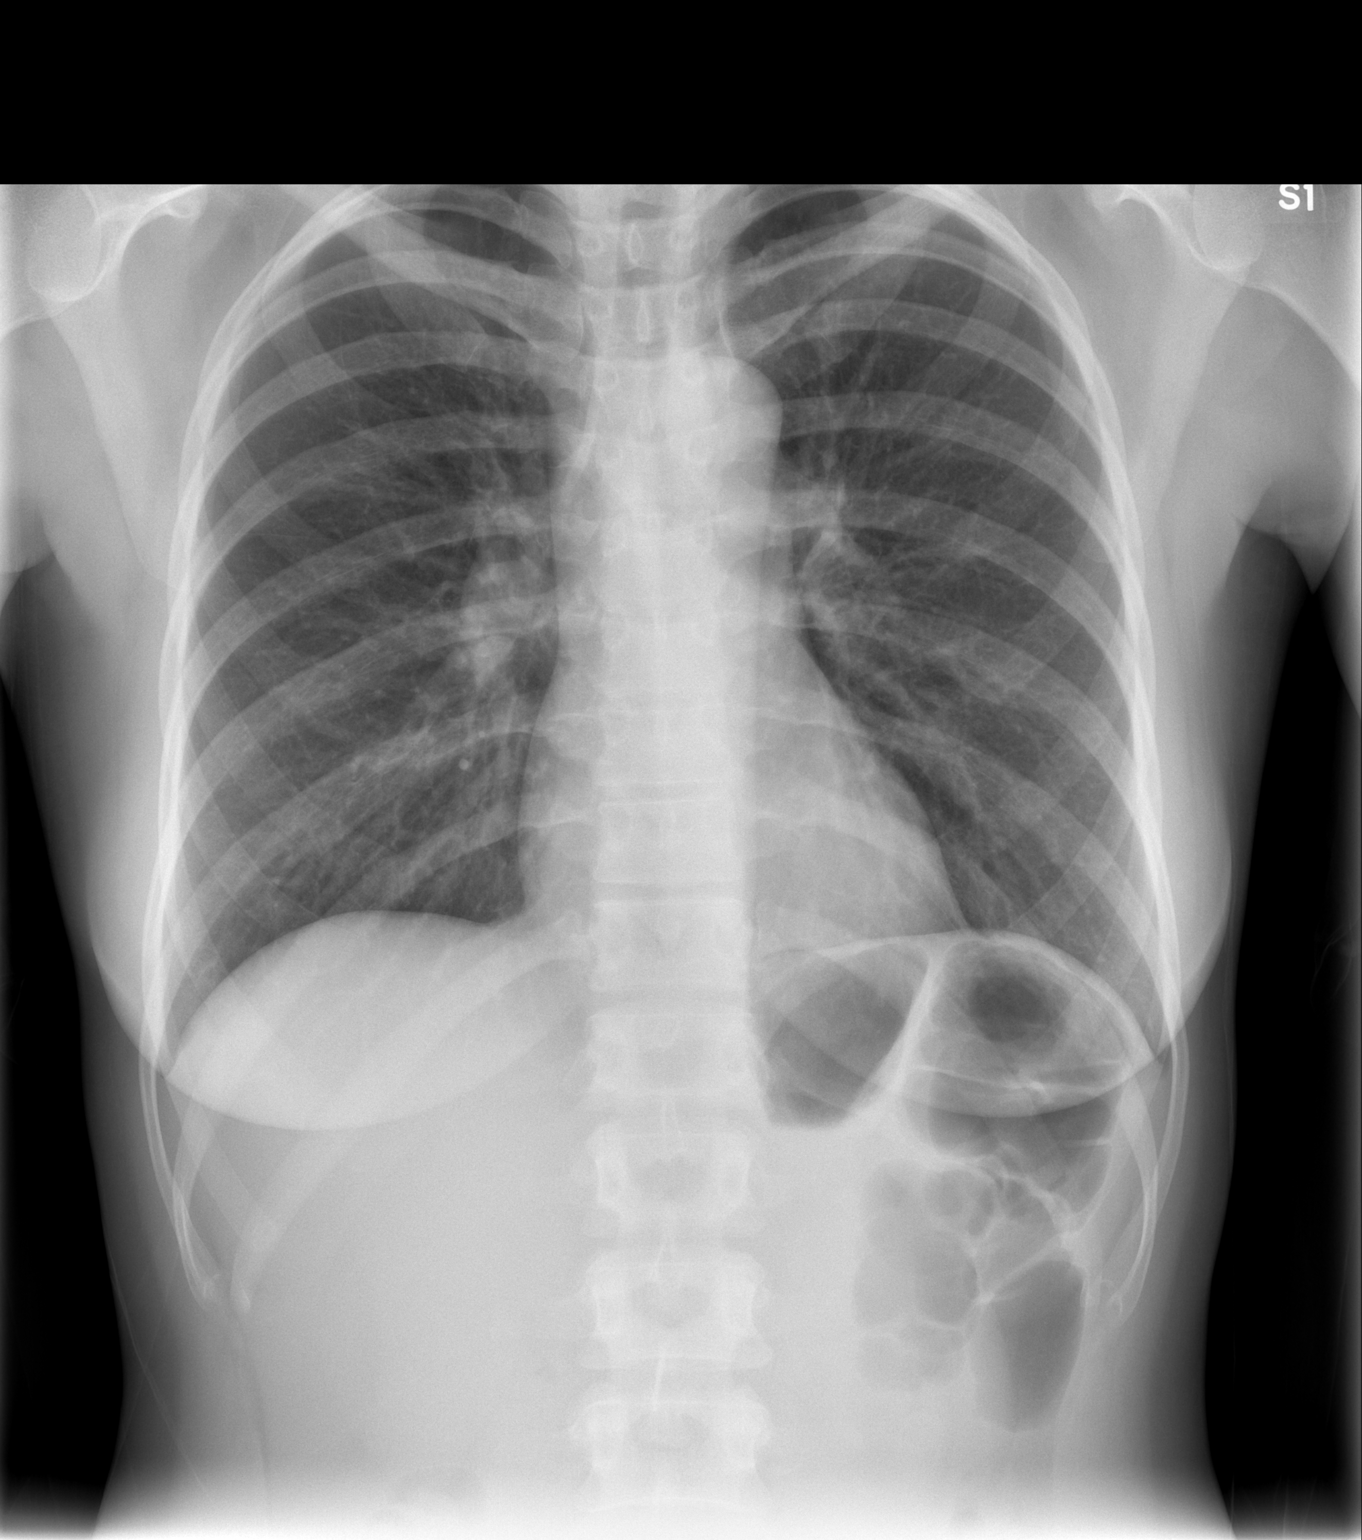

[w chest lat]
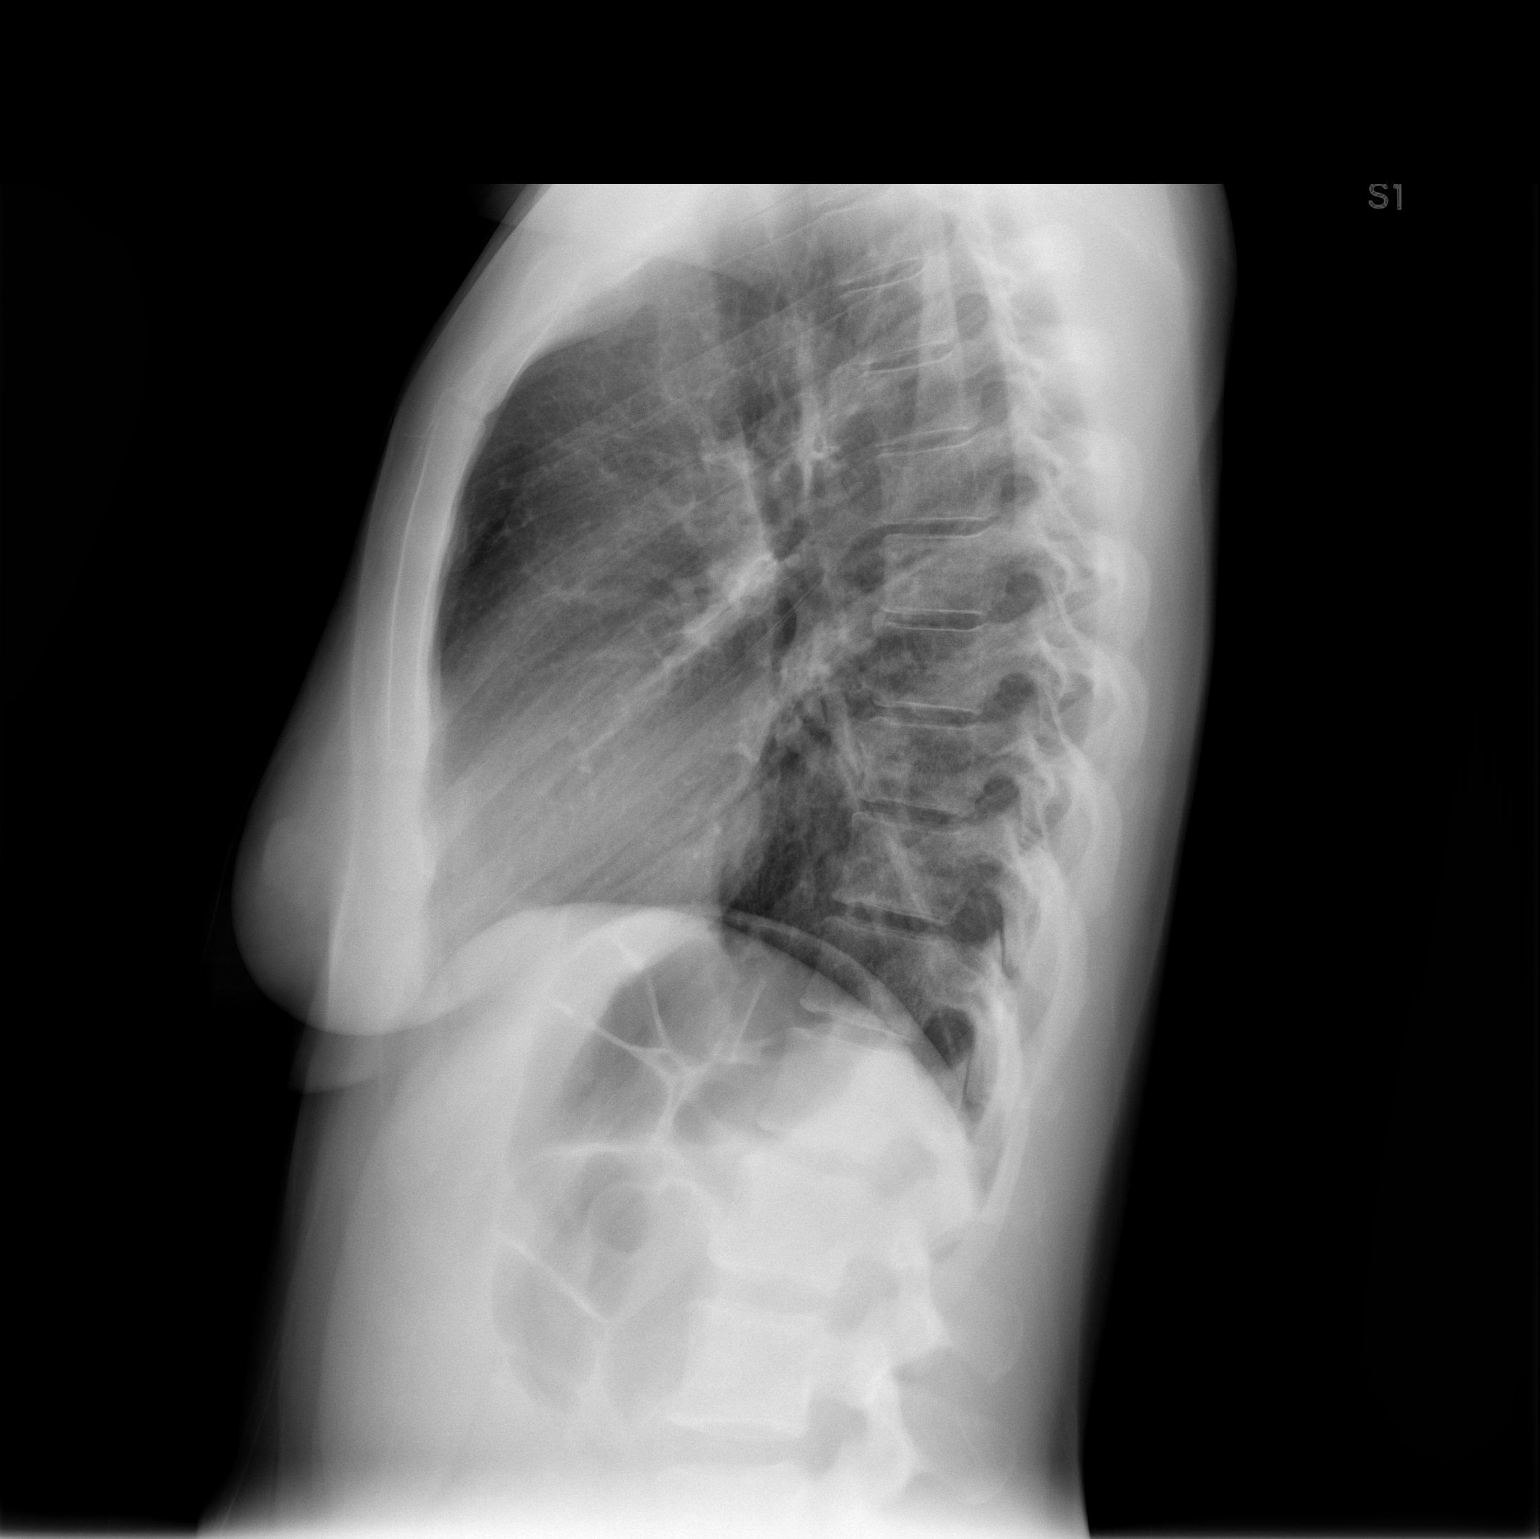

[2 of 2 positions shown; findings below may reference images not displayed]

FINDINGS: Lungs clear.  Heart size and pulmonary vascularity are
normal.  No adenopathy.  No bone lesions.
IMPRESSION: No edema or consolidation.

## 2015-02-01 ENCOUNTER — Telehealth: Payer: Self-pay | Admitting: Family Medicine

## 2015-02-01 ENCOUNTER — Encounter (HOSPITAL_COMMUNITY): Payer: Self-pay | Admitting: *Deleted

## 2015-02-01 ENCOUNTER — Emergency Department (HOSPITAL_COMMUNITY)
Admission: EM | Admit: 2015-02-01 | Discharge: 2015-02-01 | Disposition: A | Discharge status: Home / Self Care | Payer: 59 | Attending: Emergency Medicine | Admitting: Emergency Medicine

## 2015-02-01 DIAGNOSIS — R200 Anesthesia of skin: Secondary | ICD-10-CM | POA: Diagnosis present

## 2015-02-01 DIAGNOSIS — Z8719 Personal history of other diseases of the digestive system: Secondary | ICD-10-CM | POA: Insufficient documentation

## 2015-02-01 DIAGNOSIS — Z792 Long term (current) use of antibiotics: Secondary | ICD-10-CM | POA: Insufficient documentation

## 2015-02-01 DIAGNOSIS — Z862 Personal history of diseases of the blood and blood-forming organs and certain disorders involving the immune mechanism: Secondary | ICD-10-CM | POA: Diagnosis not present

## 2015-02-01 DIAGNOSIS — R202 Paresthesia of skin: Secondary | ICD-10-CM | POA: Insufficient documentation

## 2015-02-01 NOTE — Discharge Instructions (Signed)
1. Medications: ibuprofen, usual home medications 2. Treatment: rest, drink plenty of fluids, wear wrist splint 3. Follow Up: please followup with your primary doctor for discussion of your diagnoses and further evaluation after today's visit; please return to the ER for severe pain in your left hand, if you cannot feel your left hand, color change to your left hand, new or worsening symptoms   Paresthesia Paresthesia is an abnormal burning or prickling sensation. This sensation is generally felt in the hands, arms, legs, or feet. However, it may occur in any part of the body. Usually, it is not painful. The feeling may be described as:  Tingling or numbness.  Pins and needles.  Skin crawling.  Buzzing.  Limbs falling asleep.  Itching. Most people experience temporary (transient) paresthesia at some time in their lives. Paresthesia may occur when you breathe too quickly (hyperventilation). It can also occur without any apparent cause. Commonly, paresthesia occurs when pressure is placed on a nerve. The sensation quickly goes away after the pressure is removed. For some people, however, paresthesia is a long-lasting (chronic) condition that is caused by an underlying disorder. If you continue to have paresthesia, you may need further medical evaluation. HOME CARE INSTRUCTIONS Watch your condition for any changes. Taking the following actions may help to lessen any discomfort that you are feeling:  Avoid drinking alcohol.  Try acupuncture or massage to help relieve your symptoms.  Keep all follow-up visits as directed by your health care provider. This is important. SEEK MEDICAL CARE IF:  You continue to have episodes of paresthesia.  Your burning or prickling feeling gets worse when you walk.  You have pain, cramps, or dizziness.  You develop a rash. SEEK IMMEDIATE MEDICAL CARE IF:  You feel weak.  You have trouble walking or moving.  You have problems with speech,  understanding, or vision.  You feel confused.  You cannot control your bladder or bowel movements.  You have numbness after an injury.  You faint.   This information is not intended to replace advice given to you by your health care provider. Make sure you discuss any questions you have with your health care provider.   Document Released: 01/25/2002 Document Revised: 06/21/2014 Document Reviewed: 01/31/2014 Elsevier Interactive Patient Education [employer].

## 2015-02-01 NOTE — ED Notes (Signed)
Pt has c/o left hand tingling onset today. Pt states PCP directed her to ED. Pt denies CP, HA, Back pain.

## 2015-02-01 NOTE — ED Notes (Signed)
Pt is in stable condition upon d/c and ambulates from ED. 

## 2015-02-01 NOTE — ED Provider Notes (Signed)
CSN: [id number]     Arrival date & time 02/01/15  1027 History   First MD Initiated Contact with Patient 02/01/15 1044     Chief Complaint  Patient presents with  . Numbness    HPI   Rebecca Castillo is a 39 y.o. female with a PMH of anemia who presents to the ED with left hand tingling, which she states started this morning. She reports she picked up an iron and noticed pain in her palm (she states she did not burn herself). Subsequently, she reports she went to work and started to experience intermittent tingling in her left hand over her index finger and thumb radiating to her left forearm. She reports moving her arm exacerbates her symptoms. She has not tried anything for symptom relief. She denies fever, chills, chest pain, shortness of breath, abdominal pain, N/V, numbness, weakness. 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    Past Medical History  Diagnosis Date  . PONV (postoperative nausea and vomiting)   . Iron deficiency anemia     received Feraheme infusion 04/09/2013  . Umbilical hernia 0000000   Past Surgical History  Procedure Laterality Date  . Cesarean section      x 4  . Multiple tooth extractions     Family History  Problem Relation Age of Onset  . Cancer Maternal Grandmother     breast, lung  . Heart disease Mother    Social History  Substance Use Topics  . Smoking status: Never Smoker   . Smokeless tobacco: Never Used  . Alcohol Use: Yes     Comment: occasionally   OB History    No data available      Review of Systems  Constitutional: Negative for fever and chills.  Respiratory: Negative for shortness of breath.   Cardiovascular: Negative for chest pain.  Musculoskeletal: Positive for arthralgias.  Neurological: Negative for weakness and numbness.  All other systems reviewed and are negative.     Allergies  Oxycodone  Home Medications   Prior to Admission medications   Medication Sig Start Date End Date  Taking? Authorizing Provider  metroNIDAZOLE (FLAGYL) 500 MG tablet Take 1 tablet (500 mg total) by mouth 2 (two) times daily. 05/09/14   Lucille Passy, MD    BP 111/75 mmHg  Pulse 71  Temp(Src) 98 F (36.7 C) (Oral)  Resp 18  SpO2 100% Physical Exam  Constitutional: She is oriented to person, place, and time. She appears well-developed and well-nourished. No distress.  HENT:  Head: Normocephalic and atraumatic.  Right Ear: External ear normal.  Left Ear: External ear normal.  Nose: Nose normal.  Eyes: Conjunctivae and EOM are normal. Right eye exhibits no discharge. Left eye exhibits no discharge. No scleral icterus.  Neck: Normal range of motion. Neck supple.  Cardiovascular: Normal rate, regular rhythm and intact distal pulses.   Pulmonary/Chest: Effort normal and breath sounds normal. No respiratory distress.  Musculoskeletal: Normal range of motion. She exhibits no edema or tenderness.  No TTP of left hand. Full range of motion of left hand. No erythema or edema.  Neurological: She is alert and oriented to person, place, and time. She has normal strength. No sensory deficit.  Strength and sensation intact.  Skin: Skin is warm and dry. She is not diaphoretic.  Psychiatric: She has a normal mood and affect. Her behavior is normal.  Nursing note and vitals reviewed.   ED Course  Procedures (including  critical care time)  Labs Review Labs Reviewed - No data to display  Imaging Review No results found.     EKG Interpretation None      MDM   Final diagnoses:  Left hand paresthesia    39 year old female presents with left hand tingling, which she states started this morning. She denies significant pain, numbness, weakness, fever, chills, chest pain, shortness of breath, abdominal pain, N/V.   Patient is afebrile. Vital signs stable. No TTP of left hand. Full ROM of left hand. Grip strength 5/5. Sensation to light touch intact. Distal pulses intact.  Left hand  paresthesia may be consistent with carpal tunnel. Will order wrist splint. Advised patient to use ibuprofen for additional symptom relief. Patient to follow-up with PCP for further evaluation and management. Return precautions discussed. Patient verbalizes her understanding and is in agreement with plan.  BP 111/75 mmHg  Pulse 71  Temp(Src) 98 F (36.7 C) (Oral)  Resp 18  SpO2 100%     Marella Chimes, PA-C 02/01/15 2006  Davonna Belling, MD 02/02/15 [phone]

## 2015-02-01 NOTE — Telephone Encounter (Signed)
Patient Name: Rebecca Castillo DOB: 26-Jan-1976 Initial Comment caller states she injured her left hand and has tingling in her left arm Nurse Assessment Nurse: Vallery Sa, RN, [facility] Date/Time (Eastern Time): 02/01/2015 10:07:18 AM Confirm and document reason for call. If symptomatic, describe symptoms. ---Caller states she injured her left hand this morning when lifting an iron and continues to have pain with tingling. No cuts to the skin. Has the patient traveled out of the country within the last 30 days? ---No Does the patient have any new or worsening symptoms? ---Yes Will a triage be completed? ---Yes Related visit to physician within the last 2 weeks? ---No Does the PT have any chronic conditions? (i.e. diabetes, asthma, etc.) ---Yes List chronic conditions. ---Anemia in the past Did the patient indicate they were pregnant? ---No Is this a behavioral health or substance abuse call? ---No Guidelines Guideline Title Affirmed Question Affirmed Notes Hand and Wrist Injury [1] Numbness (loss of sensation) of finger(s) AND [2] present now Final Disposition User Go to ED Now Vallery Sa, RN, [facility] - ED Disagree/Comply: Comply

## 2015-02-01 NOTE — Telephone Encounter (Signed)
Per chart review pt is at [facility] now. 

## 2015-04-18 ENCOUNTER — Encounter: Payer: Self-pay | Admitting: Family Medicine

## 2015-04-18 ENCOUNTER — Ambulatory Visit (INDEPENDENT_AMBULATORY_CARE_PROVIDER_SITE_OTHER): Payer: 59 | Admitting: Family Medicine

## 2015-04-18 VITALS — BP 118/70 | HR 71 | Temp 98.1°F | Wt 175.8 lb

## 2015-04-18 DIAGNOSIS — R8290 Unspecified abnormal findings in urine: Secondary | ICD-10-CM | POA: Diagnosis not present

## 2015-04-18 DIAGNOSIS — D509 Iron deficiency anemia, unspecified: Secondary | ICD-10-CM | POA: Diagnosis not present

## 2015-04-18 LAB — CBC WITH DIFFERENTIAL/PLATELET
BASOS PCT: 0.5 % (ref 0.0–3.0)
Basophils Absolute: 0 10*3/uL (ref 0.0–0.1)
EOS ABS: 0.1 10*3/uL (ref 0.0–0.7)
Eosinophils Relative: 0.8 % (ref 0.0–5.0)
HCT: 31.9 % — ABNORMAL LOW (ref 36.0–46.0)
Hemoglobin: 10.5 g/dL — ABNORMAL LOW (ref 12.0–15.0)
Lymphocytes Relative: 36.4 % (ref 12.0–46.0)
Lymphs Abs: 2.7 10*3/uL (ref 0.7–4.0)
MCHC: 32.9 g/dL (ref 30.0–36.0)
MCV: 79.5 fl (ref 78.0–100.0)
MONO ABS: 0.6 10*3/uL (ref 0.1–1.0)
Monocytes Relative: 8.2 % (ref 3.0–12.0)
NEUTROS ABS: 4.1 10*3/uL (ref 1.4–7.7)
Neutrophils Relative %: 54.1 % (ref 43.0–77.0)
Platelets: 346 10*3/uL (ref 150.0–400.0)
RBC: 4.01 Mil/uL (ref 3.87–5.11)
RDW: 15 % (ref 11.5–15.5)
WBC: 7.5 10*3/uL (ref 4.0–10.5)

## 2015-04-18 LAB — COMPREHENSIVE METABOLIC PANEL
ALT: 17 U/L (ref 0–35)
AST: 20 U/L (ref 0–37)
Albumin: 4.1 g/dL (ref 3.5–5.2)
Alkaline Phosphatase: 55 U/L (ref 39–117)
BUN: 17 mg/dL (ref 6–23)
CHLORIDE: 105 meq/L (ref 96–112)
CO2: 26 meq/L (ref 19–32)
CREATININE: 0.72 mg/dL (ref 0.40–1.20)
Calcium: 8.9 mg/dL (ref 8.4–10.5)
GFR: 114.74 mL/min (ref >60.00)
Glucose, Bld: 88 mg/dL (ref 70–99)
Potassium: 3.6 mEq/L (ref 3.5–5.1)
SODIUM: 136 meq/L (ref 135–145)
Total Bilirubin: 0.3 mg/dL (ref 0.2–1.2)
Total Protein: 7.2 g/dL (ref 6.0–8.3)

## 2015-04-18 LAB — POC URINALSYSI DIPSTICK (AUTOMATED)
BILIRUBIN UA: NEGATIVE
Blood, UA: NEGATIVE
Glucose, UA: NEGATIVE
Ketones, UA: NEGATIVE
Leukocytes, UA: NEGATIVE
Nitrite, UA: NEGATIVE
PROTEIN UA: NEGATIVE
SPEC GRAV UA: 1.025
Urobilinogen, UA: 0.2
pH, UA: 6

## 2015-04-18 LAB — FERRITIN: FERRITIN: 4.6 ng/mL — AB (ref 10.0–291.0)

## 2015-04-18 NOTE — Patient Instructions (Signed)
Good to see you. Your urine is again clear. Let's do some blood work.  Try eating more yogurts or add a probiotic OTC.  We will call you with your results from today.

## 2015-04-18 NOTE — Assessment & Plan Note (Signed)
CBC and ferritin today 

## 2015-04-18 NOTE — Progress Notes (Signed)
Subjective:   Patient ID: Rebecca Castillo, female    DOB: 1976/02/04, 40 y.o.   MRN: [id number]  Rebecca Castillo is a pleasant 40 y.o. 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 Saw Owens Shark, NP for this on 04/29/14. Note reviewed. UA neg- advised to increase fluid intake which she has.  Symptoms still continue.  They do come and go but when it does smell bad, she is actually afraid to use the public restroom out of embarrassment. No changes in soaps or detergents. No dysuria, hematuria or vaginal discharge.  Tried cranberry tablets and AZO without improvement of symptoms.  Also notices increase desire to chew ice past few weeks- h/o iron deficiency anemia requiring IV iron in past.  Lab Results  Component Value Date   WBC 7.2 04/29/2014   HGB 12.0 04/29/2014   HCT 35.3* 04/29/2014   MCV 85.3 04/29/2014   PLT 313.0 04/29/2014   Lab Results  Component Value Date   NA 137 04/29/2014   K 3.8 04/29/2014   CL 105 04/29/2014   CO2 27 04/29/2014   Lab Results  Component Value Date   CREATININE 0.80 04/29/2014   No current outpatient prescriptions on file prior to visit.   No current facility-administered medications on file prior to visit.    Allergies  Allergen Reactions  . Oxycodone Other (See Comments)    DIZZINESS    Past Medical History  Diagnosis Date  . PONV (postoperative nausea and vomiting)   . Iron deficiency anemia     received Feraheme infusion 04/09/2013  . Umbilical hernia 0000000    Past Surgical History  Procedure Laterality Date  . Cesarean section      x 4  . Multiple tooth extractions      Family History  Problem Relation Age of Onset  . Cancer Maternal Grandmother     breast, lung  . Heart disease Mother     Social History   Social History  . Marital Status: Single    Spouse Name: N/A  . Number of Children: N/A  . Years of  Education: N/A   Occupational History  . Not on file.   Social History Main Topics  . Smoking status: Never Smoker   . Smokeless tobacco: Never Used  . Alcohol Use: Yes     Comment: occasionally  . Drug Use: No  . Sexual Activity: Not on file   Other Topics Concern  . Not on file   Social History Narrative   The PMH, PSH, Social History, Family History, Medications, and allergies have been reviewed in [facility], and have been updated if relevant.   Review of Systems  Constitutional: Negative.   HENT: Negative.   Eyes: Negative.   Respiratory: Negative.   Cardiovascular: Negative.   Gastrointestinal: Negative.   Genitourinary: Negative.   Allergic/Immunologic: Negative.   Neurological: Negative.   Hematological: Negative.   Psychiatric/Behavioral: Negative.   All other systems reviewed and are negative.      Objective:    BP 118/70 mmHg  Pulse 71  Temp(Src) 98.1 F (36.7 C) (Oral)  Wt 175 lb 12 oz (79.72 kg)  SpO2 99%   Physical Exam  Constitutional: She is oriented to person, place, and time. She appears well-developed and well-nourished. No distress.  HENT:  Head: Normocephalic.  Cardiovascular: Normal rate.   Pulmonary/Chest: Effort normal.  Neurological: She is alert  and oriented to person, place, and time. No cranial nerve deficit.  Skin: Skin is warm and dry. She is not diaphoretic.  Psychiatric: She has a normal mood and affect. Her behavior is normal. Judgment and thought content normal.  Nursing note and vitals reviewed.         Assessment & Plan:   Foul smelling urine  Iron deficiency anemia No Follow-up on file.

## 2015-04-18 NOTE — Progress Notes (Signed)
Pre visit review using our clinic review tool, if applicable. No additional management support is needed unless otherwise documented below in the visit note. 

## 2015-04-18 NOTE — Addendum Note (Signed)
Addended by: Modena Nunnery on: 04/18/2015 07:53 AM   Modules accepted: Orders

## 2015-04-18 NOTE — Assessment & Plan Note (Signed)
Persistent issue. UA again negative. Will get some lab work, ?  Probiotics.   Call or return to clinic prn if these symptoms worsen or fail to improve as anticipated. The patient indicates understanding of these issues and agrees with the plan.

## 2015-07-18 ENCOUNTER — Telehealth: Payer: Self-pay | Admitting: *Deleted

## 2015-07-18 NOTE — Telephone Encounter (Signed)
Patient called and left message needing an iron transfusion. Message forwarded to HIM.

## 2015-11-13 ENCOUNTER — Other Ambulatory Visit (HOSPITAL_COMMUNITY): Payer: Self-pay | Admitting: *Deleted

## 2015-11-14 ENCOUNTER — Ambulatory Visit (HOSPITAL_COMMUNITY)
Admission: RE | Admit: 2015-11-14 | Discharge: 2015-11-14 | Disposition: A | Discharge status: Home / Self Care | Payer: 59 | Source: Ambulatory Visit | Attending: Obstetrics and Gynecology | Admitting: Obstetrics and Gynecology

## 2015-11-14 DIAGNOSIS — D649 Anemia, unspecified: Secondary | ICD-10-CM | POA: Diagnosis present

## 2015-11-14 MED ORDER — SODIUM CHLORIDE 0.9 % IV SOLN
510.0000 mg | INTRAVENOUS | Status: DC
  Administered 2015-11-14: 510 mg via INTRAVENOUS
  Filled 2015-11-14: qty 17

## 2015-11-16 ENCOUNTER — Other Ambulatory Visit (HOSPITAL_COMMUNITY): Payer: Self-pay | Admitting: *Deleted

## 2015-11-20 ENCOUNTER — Encounter (HOSPITAL_COMMUNITY)
Admission: RE | Admit: 2015-11-20 | Discharge: 2015-11-20 | Disposition: A | Discharge status: Home / Self Care | Payer: 59 | Source: Ambulatory Visit | Attending: Obstetrics and Gynecology | Admitting: Obstetrics and Gynecology

## 2015-11-20 DIAGNOSIS — R7989 Other specified abnormal findings of blood chemistry: Secondary | ICD-10-CM | POA: Diagnosis not present

## 2015-11-20 DIAGNOSIS — D649 Anemia, unspecified: Secondary | ICD-10-CM | POA: Insufficient documentation

## 2015-11-20 MED ORDER — SODIUM CHLORIDE 0.9 % IV SOLN
510.0000 mg | INTRAVENOUS | Status: DC
  Administered 2015-11-20: 510 mg via INTRAVENOUS
  Filled 2015-11-20: qty 17

## 2015-11-20 MED ORDER — SODIUM CHLORIDE 0.9 % IV SOLN
Freq: Once | INTRAVENOUS | Status: AC
  Administered 2015-11-20: 10:00:00 via INTRAVENOUS

## 2016-11-28 ENCOUNTER — Emergency Department: Payer: 59

## 2016-11-28 ENCOUNTER — Encounter: Payer: Self-pay | Admitting: *Deleted

## 2016-11-28 ENCOUNTER — Emergency Department
Admission: EM | Admit: 2016-11-28 | Discharge: 2016-11-28 | Disposition: A | Discharge status: Home / Self Care | Payer: 59 | Attending: Emergency Medicine | Admitting: Emergency Medicine

## 2016-11-28 DIAGNOSIS — Y939 Activity, unspecified: Secondary | ICD-10-CM | POA: Diagnosis not present

## 2016-11-28 DIAGNOSIS — Y999 Unspecified external cause status: Secondary | ICD-10-CM | POA: Insufficient documentation

## 2016-11-28 DIAGNOSIS — R51 Headache: Secondary | ICD-10-CM | POA: Diagnosis present

## 2016-11-28 LAB — POCT PREGNANCY, URINE: PREG TEST UR: NEGATIVE

## 2016-11-28 MED ORDER — KETOROLAC TROMETHAMINE 30 MG/ML IJ SOLN
30.0000 mg | Freq: Once | INTRAMUSCULAR | Status: AC
Start: 2016-11-28 — End: 2016-11-28
  Administered 2016-11-28: 30 mg via INTRAMUSCULAR
  Filled 2016-11-28: qty 1

## 2016-11-28 MED ORDER — MELOXICAM 7.5 MG PO TABS: 7.5000 mg | ORAL_TABLET | Freq: Every day | ORAL | 1 refills | Status: AC

## 2016-11-28 MED ORDER — CYCLOBENZAPRINE HCL 5 MG PO TABS: 5.0000 mg | ORAL_TABLET | Freq: Three times a day (TID) | ORAL | 0 refills | Status: AC | PRN
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
## 2017-01-17 ENCOUNTER — Ambulatory Visit: Payer: Self-pay | Admitting: *Deleted

## 2017-01-17 NOTE — Telephone Encounter (Signed)
Plz advise if anything should be done prior to appt scheduled for 12.03.2018/thx dmf

## 2017-01-17 NOTE — Telephone Encounter (Signed)
Patient is concerned that now she is having heat/hot spells in her R foot on the outside edge for 2 days  Reason for Disposition . [1] Numbness or tingling in one or both feet AND [2] is a chronic symptom (recurrent or ongoing AND present > 4 weeks)  Answer Assessment - Initial Assessment Questions 1. SYMPTOM: "What is the main symptom you are concerned about?" (e.g., weakness, numbness)     Heat in R foot- for 2 days 2. ONSET: "When did this start?" (minutes, hours, days; while sleeping)     2 days ago 3. LAST NORMAL: "When was the last time you were normal (no symptoms)?"     3 days ago 4. PATTERN "Does this come and go, or has it been constant since it started?"  "Is it present now?"     Comes/goes- last seconds patient has had restless legs ay night for some time- but never diagnosed 5. CARDIAC SYMPTOMS: "Have you had any of the following symptoms: chest pain, difficulty breathing, palpitations?"     no 6. NEUROLOGIC SYMPTOMS: "Have you had any of the following symptoms: headache, dizziness, vision loss, double vision, changes in speech, unsteady on your feet?"     Headache - migraine 7. OTHER SYMPTOMS: "Do you have any other symptoms?"     no 8. PREGNANCY: "Is there any chance you are pregnant?" "When was your last menstrual period?"     BTL- LMP 2 weeks ago  Protocols used: NEUROLOGIC DEFICIT-A-AH

## 2017-01-20 ENCOUNTER — Encounter: Payer: Self-pay | Admitting: Family Medicine

## 2017-01-20 ENCOUNTER — Ambulatory Visit: Payer: 59 | Admitting: Family Medicine

## 2017-01-20 DIAGNOSIS — G2581 Restless legs syndrome: Secondary | ICD-10-CM | POA: Insufficient documentation

## 2017-01-20 DIAGNOSIS — R202 Paresthesia of skin: Secondary | ICD-10-CM | POA: Diagnosis not present

## 2017-01-20 NOTE — Progress Notes (Signed)
Subjective:   Patient ID: Rebecca Castillo, female    DOB: 1975-02-25, 41 y.o.   MRN: [id number]  Rebecca Castillo is a pleasant 41 y.o. year old female who presents to clinic today with Foot Problem (Patient is here today C/O a hot sensation on the right side of her right foot which started on 11.28.18. ) and Leg Problem (Patient is also C/O a change in RLS.  She states that she has had it for years at night but now it is starting to happen during the day and even while driving.)  on 95/03/8411  HPI:  H/o anemia- Has been receiving iron infusions every year or two. Not talking oral iron- cannot tolerate.  Since last week, lateral edge of right foot feels hot and tingly. No known injury.  Also having more RLS symptoms (this was self diagnosed and has never been treated).   No current outpatient medications on file prior to visit.   No current facility-administered medications on file prior to visit.     Allergies  Allergen Reactions  . Oxycodone Other (See Comments)    DIZZINESS    Past Medical History:  Diagnosis Date  . Iron deficiency anemia    received Feraheme infusion 04/09/2013  . PONV (postoperative nausea and vomiting)   . Umbilical hernia 03/4399    Past Surgical History:  Procedure Laterality Date  . CESAREAN SECTION     x 4  . MULTIPLE TOOTH EXTRACTIONS      Family History  Problem Relation Age of Onset  . Heart disease Mother   . Cancer Maternal Grandmother        breast, lung    Social History   Socioeconomic History  . Marital status: Single    Spouse name: Not on file  . Number of children: Not on file  . Years of education: Not on file  . Highest education level: Not on file  Social Needs  . Financial resource strain: Not on file  . Food insecurity - worry: Not on file  . Food insecurity - inability: Not on file  . Transportation needs - medical: Not on file  . Transportation needs - non-medical: Not on file  Occupational History  . Not on  file  Tobacco Use  . Smoking status: Never Smoker  . Smokeless tobacco: Never Used  Substance and Sexual Activity  . Alcohol use: Yes    Comment: occasionally  . Drug use: No  . Sexual activity: Not on file  Other Topics Concern  . Not on file  Social History Narrative  . Not on file   The PMH, PSH, Social History, Family History, Medications, and allergies have been reviewed in [facility] and have been updated if relevant.   Review of Systems  Musculoskeletal: Positive for arthralgias.  Neurological: Positive for numbness.  Hematological: Negative.   Psychiatric/Behavioral: Negative.   All other systems reviewed and are negative.      Objective:    BP 118/76 (BP Location: Left Arm, Patient Position: Sitting, Cuff Size: Normal)   Pulse 74   Temp 98.2 F (36.8 C) (Oral)   Ht 5\' 7"  (1.702 m)   Wt 173 lb 6.4 oz (78.7 kg)   SpO2 100%   BMI 27.16 kg/m    Physical Exam  Constitutional: She is oriented to person, place, and time. She appears well-developed and well-nourished. No distress.  HENT:  Head: Normocephalic and atraumatic.  Eyes: Conjunctivae are normal.  Cardiovascular: Normal rate.  Pulmonary/Chest: Effort normal.  Musculoskeletal: Normal range of motion.  Neurological: She is alert and oriented to person, place, and time. No cranial nerve deficit.  Normal sensation of right foot  Skin: Skin is warm and dry. She is not diaphoretic.  Psychiatric: She has a normal mood and affect. Her behavior is normal. Thought content normal.  Nursing note and vitals reviewed.         Assessment & Plan:   Paresthesia - Plan: Zinc, B12, TSH, Comprehensive metabolic panel, CBC  RLS (restless legs syndrome) - Plan: Zinc, B12, TSH, Comprehensive metabolic panel, CBC No Follow-up on file.

## 2017-01-20 NOTE — Telephone Encounter (Signed)
It looks like I am seeing her today.  Will address at [place].

## 2017-01-20 NOTE — Patient Instructions (Signed)
Great to see you.  We will call you with your results and you can view them online.

## 2017-01-21 ENCOUNTER — Other Ambulatory Visit: Payer: 59

## 2017-01-21 NOTE — Assessment & Plan Note (Signed)
New with worsening RLS type symptoms. Etiology unclear at this point.  Exam unremarkable, grossly. Will check labs as part of initial work up. The patient indicates understanding of these issues and agrees with the plan. Orders Placed This Encounter  Procedures  . B12  . CBC  . TSH  . HgB A1c  . Comprehensive metabolic panel  . Zinc

## 2017-01-22 ENCOUNTER — Other Ambulatory Visit (INDEPENDENT_AMBULATORY_CARE_PROVIDER_SITE_OTHER): Payer: 59

## 2017-01-22 DIAGNOSIS — G2581 Restless legs syndrome: Secondary | ICD-10-CM

## 2017-01-22 DIAGNOSIS — R202 Paresthesia of skin: Secondary | ICD-10-CM | POA: Diagnosis not present

## 2017-01-23 LAB — CBC
HCT: 31.9 % — ABNORMAL LOW (ref 36.0–46.0)
HEMOGLOBIN: 10.1 g/dL — AB (ref 12.0–15.0)
MCHC: 31.6 g/dL (ref 30.0–36.0)
MCV: 76.4 fl — AB (ref 78.0–100.0)
PLATELETS: 394 10*3/uL (ref 150.0–400.0)
RBC: 4.17 Mil/uL (ref 3.87–5.11)
RDW: 16.3 % — ABNORMAL HIGH (ref 11.5–15.5)
WBC: 7.6 10*3/uL (ref 4.0–10.5)

## 2017-01-23 LAB — COMPREHENSIVE METABOLIC PANEL
ALBUMIN: 4.1 g/dL (ref 3.5–5.2)
ALT: 22 U/L (ref 0–35)
AST: 21 U/L (ref 0–37)
Alkaline Phosphatase: 60 U/L (ref 39–117)
BUN: 13 mg/dL (ref 6–23)
CHLORIDE: 104 meq/L (ref 96–112)
CO2: 26 meq/L (ref 19–32)
CREATININE: 0.73 mg/dL (ref 0.40–1.20)
Calcium: 8.6 mg/dL (ref 8.4–10.5)
GFR: 111.97 mL/min (ref >60.00)
GLUCOSE: 88 mg/dL (ref 70–99)
POTASSIUM: 3.8 meq/L (ref 3.5–5.1)
SODIUM: 137 meq/L (ref 135–145)
Total Bilirubin: 0.3 mg/dL (ref 0.2–1.2)
Total Protein: 6.9 g/dL (ref 6.0–8.3)

## 2017-01-23 LAB — HEMOGLOBIN A1C: HEMOGLOBIN A1C: 5.8 % (ref 4.6–6.5)

## 2017-01-23 LAB — TSH: TSH: 0.74 u[IU]/mL (ref 0.35–4.50)

## 2017-01-23 LAB — VITAMIN B12: Vitamin B-12: 189 pg/mL — ABNORMAL LOW (ref 211–911)

## 2017-01-25 LAB — ZINC: Zinc: 76 ug/dL (ref 60–130)

## 2017-01-31 ENCOUNTER — Other Ambulatory Visit: Payer: Self-pay | Admitting: Family Medicine

## 2017-01-31 DIAGNOSIS — D509 Iron deficiency anemia, unspecified: Secondary | ICD-10-CM

## 2017-02-01 ENCOUNTER — Telehealth: Payer: Self-pay | Admitting: Hematology and Oncology

## 2017-02-01 NOTE — Telephone Encounter (Signed)
Confirmed upcoming appointment with patient, patient would like to be seen sooner than later if possible

## 2017-02-18 HISTORY — PX: OTHER SURGICAL HISTORY: SHX169

## 2017-02-18 HISTORY — PX: ABDOMINAL HYSTERECTOMY: SHX81

## 2017-02-18 HISTORY — PX: UMBILICAL HERNIA REPAIR: SHX196

## 2017-02-24 ENCOUNTER — Inpatient Hospital Stay: Payer: 59

## 2017-02-24 ENCOUNTER — Inpatient Hospital Stay: Payer: 59 | Attending: Hematology and Oncology | Admitting: Hematology and Oncology

## 2017-02-24 ENCOUNTER — Telehealth: Payer: Self-pay | Admitting: Hematology and Oncology

## 2017-02-24 VITALS — BP 131/82 | HR 68 | Temp 98.4°F | Resp 18 | Ht 67.0 in | Wt 179.2 lb

## 2017-02-24 DIAGNOSIS — D500 Iron deficiency anemia secondary to blood loss (chronic): Secondary | ICD-10-CM

## 2017-02-24 DIAGNOSIS — E538 Deficiency of other specified B group vitamins: Secondary | ICD-10-CM | POA: Diagnosis not present

## 2017-02-24 DIAGNOSIS — D509 Iron deficiency anemia, unspecified: Secondary | ICD-10-CM

## 2017-02-24 NOTE — Progress Notes (Signed)
[name] NOTE  Patient Care Team: Lucille Passy, MD as PCP - General  CHIEF COMPLAINTS/PURPOSE OF CONSULTATION:  Iron deficiency anemia and B12 deficiency  HISTORY OF PRESENTING ILLNESS:  Rebecca Castillo 42 y.o. female is here because of recent diagnosis of iron deficiency anemia.  Patient has had long-standing history of iron deficiency anemia suspected to be due to malabsorption.  She was initially detected in 2014.  She was seen by my partner Dr. Alen Blew who gave her IV iron therapy every other year.  Last year she needed to have it transfused as well.  Recently she had blood work with her primary care office who encouraged her to get IV iron treatment and she is here today to discuss getting iron infusion.  Incidentally she was also noted to have performed B12 deficiency.  She was put on oral B12 supplementation.  She was reportedly having symptoms related to B12 deficiency in terms of paresthesias in the feet.  I reviewed her records extensively and collaborated the history with the patient.  MEDICAL HISTORY:  Past Medical History:  Diagnosis Date  . Iron deficiency anemia    received Feraheme infusion 04/09/2013  . PONV (postoperative nausea and vomiting)   . Umbilical hernia 05/3327    SURGICAL HISTORY: Past Surgical History:  Procedure Laterality Date  . CESAREAN SECTION     x 4  . MULTIPLE TOOTH EXTRACTIONS      SOCIAL HISTORY: Social History   Socioeconomic History  . Marital status: Single    Spouse name: Not on file  . Number of children: Not on file  . Years of education: Not on file  . Highest education level: Not on file  Social Needs  . Financial resource strain: Not on file  . Food insecurity - worry: Not on file  . Food insecurity - inability: Not on file  . Transportation needs - medical: Not on file  . Transportation needs - non-medical: Not on file  Occupational History  . Not on file  Tobacco Use  . Smoking status: Never  Smoker  . Smokeless tobacco: Never Used  Substance and Sexual Activity  . Alcohol use: Yes    Comment: occasionally  . Drug use: No  . Sexual activity: Not on file  Other Topics Concern  . Not on file  Social History Narrative  . Not on file    FAMILY HISTORY: Family History  Problem Relation Age of Onset  . Heart disease Mother   . Cancer Maternal Grandmother        breast, lung    ALLERGIES:  is allergic to oxycodone.  MEDICATIONS:  No current outpatient medications on file.   No current facility-administered medications for this visit.     REVIEW OF SYSTEMS:   Constitutional: Fatigue, dizziness Eyes: Denies blurriness of vision, double vision or watery eyes Ears, nose, mouth, throat, and face: Denies mucositis or sore throat Respiratory: Denies cough, dyspnea or wheezes Cardiovascular: Denies palpitation, chest discomfort or lower extremity swelling Gastrointestinal:  Denies nausea, heartburn or change in bowel habits Skin: Denies abnormal skin rashes Lymphatics: Denies new lymphadenopathy or easy bruising Neurological:Denies numbness, tingling or new weaknesses Behavioral/Psych: Mood is stable, no new changes   All other systems were reviewed with the patient and are negative.  PHYSICAL EXAMINATION: ECOG PERFORMANCE STATUS: 1 - Symptomatic but completely ambulatory  Vitals:   02/24/17 1601  BP: 131/82  Pulse: 68  Resp: 18  Temp: 98.4 F (36.9 C)  SpO2:  97%   Filed Weights   02/24/17 1601  Weight: 179 lb 3.2 oz (81.3 kg)    GENERAL:alert, no distress and comfortable SKIN: skin color, texture, turgor are normal, no rashes or significant lesions EYES: normal, conjunctiva are pink and non-injected, sclera clear OROPHARYNX:no exudate, no erythema and lips, buccal mucosa, and tongue normal  NECK: supple, thyroid normal size, non-tender, without nodularity LYMPH:  no palpable lymphadenopathy in the cervical, axillary or inguinal LUNGS: clear to  auscultation and percussion with normal breathing effort HEART: regular rate & rhythm and no murmurs and no lower extremity edema ABDOMEN:abdomen soft, non-tender and normal bowel sounds Musculoskeletal:no cyanosis of digits and no clubbing  PSYCH: alert & oriented x 3 with fluent speech NEURO: no focal motor/sensory deficits  LABORATORY DATA:  I have reviewed the data as listed Lab Results  Component Value Date   WBC 7.6 01/22/2017   HGB 10.1 (L) 01/22/2017   HCT 31.9 (L) 01/22/2017   MCV 76.4 (L) 01/22/2017   PLT 394.0 01/22/2017   Lab Results  Component Value Date   NA 137 01/22/2017   K 3.8 01/22/2017   CL 104 01/22/2017   CO2 26 01/22/2017    RADIOGRAPHIC STUDIES: I have personally reviewed the radiological reports and agreed with the findings in the report.  ASSESSMENT AND PLAN:  Iron deficiency anemia Iron deficiency anemia: I discussed with the patient the process of iron absorption. I counseled extensively regarding the different causes of iron deficiency including blood loss and malabsorption. Patient has had upper endoscopies and colonoscopies and did not have any clear identified source of blood loss.  She does have heavy menstrual cycles related to fibroids. She is contemplating on getting a hysterectomy.  Patient is intolerant to oral iron therapy.  Because of this I recommended IV iron.  Recommendation: proceed with 2 doses of IV iron infusion with Feraheme  Return to clinic in 6 months with labs done ahead of time  B12 deficiency Profound B12 deficiency with a B12 level of 186 mg.  I discussed with her about the different causes of B12 deficiency and I suspect that she may have pernicious anemia.  I would like to obtain an anti-intrinsic factor and anti-parietal cell antibodies.  Plan: We will administer 2 doses of B12 along with her iron infusions.  When she comes back in 6 months we will recheck her B12 levels.  If she is low then she may need monthly  B12 injections.  Patient has had paresthesias which is a pretty profound neurological symptom of B12 deficiency.  She was put on oral B12 replacement therapy.   All questions were answered. The patient knows to call the clinic with any problems, questions or concerns.    Harriette Ohara, MD 02/24/17

## 2017-02-24 NOTE — Telephone Encounter (Signed)
Scheduled appt per 1/7 los - unable to schedule IRON due to capped days - Gave patient AVS and calender per los. - Pt aware they will be called for the Iron appts when added.

## 2017-02-24 NOTE — Assessment & Plan Note (Signed)
Profound B12 deficiency with a B12 level of 186 mg.  I discussed with her about the different causes of B12 deficiency and I suspect that she may have pernicious anemia.  I would like to obtain an anti-intrinsic factor and anti-parietal cell antibodies.  Plan: We will administer 2 doses of B12 along with her iron infusions.  When she comes back in 6 months we will recheck her B12 levels.  If she is low then she may need monthly B12 injections.  Patient has had paresthesias which is a pretty profound neurological symptom of B12 deficiency.  She was put on oral B12 replacement therapy.

## 2017-02-24 NOTE — Assessment & Plan Note (Signed)
Iron deficiency anemia: I discussed with the patient the process of iron absorption. I counseled extensively regarding the different causes of iron deficiency including blood loss and malabsorption. Patient has had upper endoscopies and colonoscopies and did not have any clear identified source of blood loss.  She does have heavy menstrual cycles related to fibroids. She is contemplating on getting a hysterectomy.  Patient is intolerant to oral iron therapy.  Because of this I recommended IV iron.  Recommendation: proceed with 2 doses of IV iron infusion with Feraheme  Return to clinic in 6 months with labs done ahead of time

## 2017-02-25 ENCOUNTER — Telehealth: Payer: Self-pay | Admitting: Hematology and Oncology

## 2017-02-25 LAB — IRON AND TIBC
Iron: 23 ug/dL — ABNORMAL LOW (ref 41–142)
Saturation Ratios: 5 % — ABNORMAL LOW (ref 21–57)
TIBC: 426 ug/dL (ref 236–444)
UIBC: 403 ug/dL

## 2017-02-25 LAB — FERRITIN: FERRITIN: 5 ng/mL — AB (ref 9–269)

## 2017-02-25 NOTE — Telephone Encounter (Signed)
Scheduled iv Iron per 1/7 los - Left message with patient with appt date and time

## 2017-02-27 LAB — CELIAC PANEL 10
ANTIGLIADIN ABS, IGA: 7 U (ref 0–19)
ENDOMYSIAL ANTIBODY IGA: NEGATIVE
Gliadin IgG: 2 units (ref 0–19)
IgA: 176 mg/dL (ref 87–352)
Tissue Transglut Ab: <2 U/mL (ref 0–5)
Tissue Transglutaminase Ab, IgA: <2 U/mL (ref 0–3)

## 2017-02-27 LAB — ANTI-PARIETAL ANTIBODY: Parietal Cell Antibody-IgG: 7.4 Units (ref 0.0–20.0)

## 2017-02-27 LAB — INTRINSIC FACTOR ANTIBODIES: Intrinsic Factor: 1 AU/mL (ref 0.0–1.1)

## 2017-02-28 ENCOUNTER — Other Ambulatory Visit: Payer: Self-pay | Admitting: Hematology and Oncology

## 2017-02-28 ENCOUNTER — Inpatient Hospital Stay: Payer: 59

## 2017-02-28 VITALS — BP 128/88 | HR 55 | Temp 97.7°F | Resp 18

## 2017-02-28 DIAGNOSIS — D509 Iron deficiency anemia, unspecified: Secondary | ICD-10-CM | POA: Diagnosis not present

## 2017-02-28 MED ORDER — SODIUM CHLORIDE 0.9 % IV SOLN
Freq: Once | INTRAVENOUS | Status: AC
  Administered 2017-02-28: 09:00:00 via INTRAVENOUS

## 2017-02-28 MED ORDER — SODIUM CHLORIDE 0.9 % IV SOLN
510.0000 mg | Freq: Once | INTRAVENOUS | Status: AC
  Administered 2017-02-28: 510 mg via INTRAVENOUS
  Filled 2017-02-28: qty 17

## 2017-02-28 MED ORDER — CYANOCOBALAMIN 1000 MCG/ML IJ SOLN
1000.0000 ug | Freq: Once | INTRAMUSCULAR | Status: AC
  Administered 2017-02-28: 1000 ug via INTRAMUSCULAR

## 2017-02-28 NOTE — Patient Instructions (Signed)

## 2017-03-07 ENCOUNTER — Inpatient Hospital Stay: Payer: 59

## 2017-03-07 VITALS — BP 118/71 | HR 60 | Temp 98.9°F | Resp 18

## 2017-03-07 DIAGNOSIS — D509 Iron deficiency anemia, unspecified: Secondary | ICD-10-CM | POA: Diagnosis not present

## 2017-03-07 MED ORDER — SODIUM CHLORIDE 0.9 % IV SOLN
510.0000 mg | Freq: Once | INTRAVENOUS | Status: AC
  Administered 2017-03-07: 510 mg via INTRAVENOUS
  Filled 2017-03-07: qty 17

## 2017-03-07 MED ORDER — ALTEPLASE 2 MG IJ SOLR
2.0000 mg | Freq: Once | INTRAMUSCULAR | Status: DC | PRN
  Filled 2017-03-07: qty 2

## 2017-03-07 MED ORDER — HEPARIN SOD (PORK) LOCK FLUSH 100 UNIT/ML IV SOLN
500.0000 [IU] | Freq: Once | INTRAVENOUS | Status: DC | PRN
  Filled 2017-03-07: qty 5

## 2017-03-07 MED ORDER — HEPARIN SOD (PORK) LOCK FLUSH 100 UNIT/ML IV SOLN
250.0000 [IU] | Freq: Once | INTRAVENOUS | Status: DC | PRN
  Filled 2017-03-07: qty 5

## 2017-03-07 MED ORDER — SODIUM CHLORIDE 0.9% FLUSH
3.0000 mL | Freq: Once | INTRAVENOUS | Status: DC | PRN
  Filled 2017-03-07: qty 10

## 2017-03-07 MED ORDER — SODIUM CHLORIDE 0.9 % IV SOLN
Freq: Once | INTRAVENOUS | Status: AC
  Administered 2017-03-07: 08:00:00 via INTRAVENOUS

## 2017-03-07 MED ORDER — CYANOCOBALAMIN 1000 MCG/ML IJ SOLN
1000.0000 ug | Freq: Once | INTRAMUSCULAR | Status: AC
  Administered 2017-03-07: 1000 ug via INTRAMUSCULAR

## 2017-03-07 MED ORDER — SODIUM CHLORIDE 0.9% FLUSH
10.0000 mL | INTRAVENOUS | Status: DC | PRN
  Filled 2017-03-07: qty 10

## 2017-03-07 NOTE — Patient Instructions (Signed)

## 2017-03-07 NOTE — Progress Notes (Signed)
Ok for B-12 injection today per Dr. Lindi Adie

## 2017-08-12 ENCOUNTER — Other Ambulatory Visit: Payer: Self-pay

## 2017-08-12 ENCOUNTER — Encounter (HOSPITAL_COMMUNITY): Payer: Self-pay | Admitting: Emergency Medicine

## 2017-08-12 ENCOUNTER — Ambulatory Visit (HOSPITAL_COMMUNITY)
Admission: EM | Admit: 2017-08-12 | Discharge: 2017-08-12 | Disposition: A | Discharge status: Home / Self Care | Payer: 59 | Attending: Family Medicine | Admitting: Family Medicine

## 2017-08-12 DIAGNOSIS — B029 Zoster without complications: Secondary | ICD-10-CM | POA: Diagnosis not present

## 2017-08-12 MED ORDER — VALACYCLOVIR HCL 1 G PO TABS: 1000.0000 mg | ORAL_TABLET | Freq: Three times a day (TID) | ORAL | 0 refills | Status: DC

## 2017-08-12 NOTE — Discharge Instructions (Signed)
Take the virus antibiotic 3 times a day Watch for signs of infection Try not to scratch open or pick at the rash Return if worse

## 2017-08-12 NOTE — ED Provider Notes (Signed)
[facility]    CSN: [id number] Arrival date & time: 08/12/17  1615     History   Chief Complaint Chief Complaint  Patient presents with  . Insect Bite    HPI Rebecca Castillo is a 42 y.o. female.   HPI  Patient is here complaining of insect bites.  She states that she was out in the yard few days ago and felt a sting on her left side.  She states that since then she has had more pain on her left side, and more "bites" breaking out.  No itching.  Is more painful.  She did have a chickenpox as a child.  She does have exposure to shingles recently from a lady at work.  No fever or illness personally, she feels well.  She has not had a shingles vaccination (not indicated at her age).  She denies any immunocompromise, recent illness, or stress.  Past Medical History:  Diagnosis Date  . Iron deficiency anemia    received Feraheme infusion 04/09/2013  . PONV (postoperative nausea and vomiting)   . Umbilical hernia 07/5782    Patient Active Problem List   Diagnosis Date Noted  . B12 deficiency 02/24/2017  . Paresthesia 01/20/2017  . RLS (restless legs syndrome) 01/20/2017  . Foul smelling urine 04/29/2014  . Arthritis 04/01/2013  . Family history of early CAD 07/01/2011  . Umbilical hernia 69/62/9528  . UNSPECIFIED GENITAL HERPES 03/19/2010  . Iron deficiency anemia 03/19/2010    Past Surgical History:  Procedure Laterality Date  . CESAREAN SECTION     x 4  . MULTIPLE TOOTH EXTRACTIONS      OB History   None      Home Medications    Prior to Admission medications   Medication Sig Start Date End Date Taking? Authorizing Provider  valACYclovir (VALTREX) 1000 MG tablet Take 1 tablet (1,000 mg total) by mouth 3 (three) times daily. 08/12/17   Raylene Everts, MD    Family History Family History  Problem Relation Age of Onset  . Heart disease Mother   . Cancer Maternal Grandmother        breast, lung    Social History Social History   Tobacco Use    . Smoking status: Never Smoker  . Smokeless tobacco: Never Used  Substance Use Topics  . Alcohol use: Yes    Comment: occasionally  . Drug use: No     Allergies   Oxycodone   Review of Systems Review of Systems  Constitutional: Negative for chills and fever.  HENT: Negative for ear pain and sore throat.   Eyes: Negative for pain and visual disturbance.  Respiratory: Negative for cough and shortness of breath.   Cardiovascular: Negative for chest pain and palpitations.  Gastrointestinal: Negative for abdominal pain and vomiting.  Genitourinary: Negative for dysuria and hematuria.  Musculoskeletal: Negative for arthralgias and back pain.  Skin: Positive for rash. Negative for color change.  Neurological: Negative for seizures and syncope.  All other systems reviewed and are negative.    Physical Exam Triage Vital Signs ED Triage Vitals  Enc Vitals Group     BP 08/12/17 1630 134/88     Pulse Rate 08/12/17 1630 61     Resp 08/12/17 1630 16     Temp 08/12/17 1630 98.4 F (36.9 C)     Temp Source 08/12/17 1630 Oral     SpO2 08/12/17 1630 100 %     Weight --  Height --      Head Circumference --      Peak Flow --      Pain Score 08/12/17 1631 5     Pain Loc --      Pain Edu? --      Excl. in [place]? --    No data found.  Updated Vital Signs BP 134/88 (BP Location: Left Arm)   Pulse 61   Temp 98.4 F (36.9 C) (Oral)   Resp 16   SpO2 100%      Physical Exam  Constitutional: She appears well-developed and well-nourished. No distress.  HENT:  Head: Normocephalic and atraumatic.  Mouth/Throat: Oropharynx is clear and moist.  Eyes: Pupils are equal, round, and reactive to light. Conjunctivae are normal.  Neck: Normal range of motion.  Cardiovascular: Normal rate, regular rhythm and normal heart sounds.  Pulmonary/Chest: Effort normal and breath sounds normal. No respiratory distress.  Abdominal: Soft. She exhibits no distension.  Musculoskeletal: Normal  range of motion. She exhibits no edema.  Neurological: She is alert.  Skin: Skin is warm and dry.  Underneath left axilla in the T3-4 region there is a cluster of vesicles on erythematous base with more lesions around the thoracic area in the same dermatome, few scattered.  No drainage or erythema to suggest infection.  On the right antecubital fossa is in erythematous macular approximately 2 cm across, which patient states showed up yesterday after working in the yard.  Psychiatric: She has a normal mood and affect. Her behavior is normal.     UC Treatments / Results  Labs (all labs ordered are listed, but only abnormal results are displayed) Labs Reviewed - No data to display  EKG None  Radiology No results found.  Procedures Procedures (including critical care time)  Medications Ordered in UC Medications - No data to display  Initial Impression / Assessment and Plan / UC Course  I have reviewed the triage vital signs and the nursing notes.  Pertinent labs & imaging results that were available during my care of the patient were reviewed by me and considered in my medical decision making (see chart for details).     Patient was quite skeptical of my diagnosis of shingles.  She frankly did not believe me.  I offered to bring in another physician for consultation.  Dr. Posey Rea kindly came and look at the patient with me.  He reassured her that this was indeed very typical for shingles.  I explained to her that she is likely to have a mild case that is only moderately painful.  It is likely that we have a treatment that will help her.  But she should still get a shingles shot when she turns 49.  Wound care discussed. Final Clinical Impressions(s) / UC Diagnoses   Final diagnoses:  Herpes zoster without complication     Discharge Instructions     Take the virus antibiotic 3 times a day Watch for signs of infection Try not to scratch open or pick at the rash Return if  worse    ED Prescriptions    Medication Sig Dispense Auth. Provider   valACYclovir (VALTREX) 1000 MG tablet Take 1 tablet (1,000 mg total) by mouth 3 (three) times daily. 21 tablet Raylene Everts, MD     Controlled Substance Prescriptions [place] Controlled Substance Registry consulted? Not Applicable   Raylene Everts, MD 08/12/17 1843

## 2017-08-12 NOTE — ED Triage Notes (Signed)
The patient presented to the [facility] with a complaint of multiple insect bites that occurred 1 week ago.

## 2017-08-15 ENCOUNTER — Other Ambulatory Visit: Payer: Self-pay

## 2017-08-15 DIAGNOSIS — D509 Iron deficiency anemia, unspecified: Secondary | ICD-10-CM

## 2017-08-15 DIAGNOSIS — E538 Deficiency of other specified B group vitamins: Secondary | ICD-10-CM

## 2017-08-18 ENCOUNTER — Inpatient Hospital Stay: Payer: 59 | Attending: Hematology and Oncology

## 2017-08-18 DIAGNOSIS — E538 Deficiency of other specified B group vitamins: Secondary | ICD-10-CM

## 2017-08-18 DIAGNOSIS — D509 Iron deficiency anemia, unspecified: Secondary | ICD-10-CM | POA: Diagnosis present

## 2017-08-18 LAB — CMP (CANCER CENTER ONLY)
ALT: 13 U/L (ref 0–44)
AST: 13 U/L — AB (ref 15–41)
Albumin: 4 g/dL (ref 3.5–5.0)
Alkaline Phosphatase: 66 U/L (ref 38–126)
Anion gap: 7 (ref 5–15)
BUN: 11 mg/dL (ref 6–20)
CHLORIDE: 107 mmol/L (ref 98–111)
CO2: 25 mmol/L (ref 22–32)
CREATININE: 0.84 mg/dL (ref 0.44–1.00)
Calcium: 9.2 mg/dL (ref 8.9–10.3)
GFR, Est AFR Am: >60 mL/min (ref >60)
Glucose, Bld: 104 mg/dL — ABNORMAL HIGH (ref 70–99)
Potassium: 3.7 mmol/L (ref 3.5–5.1)
Sodium: 139 mmol/L (ref 135–145)
Total Bilirubin: 0.2 mg/dL — ABNORMAL LOW (ref 0.3–1.2)
Total Protein: 7.1 g/dL (ref 6.5–8.1)

## 2017-08-18 LAB — CBC WITH DIFFERENTIAL (CANCER CENTER ONLY)
Basophils Absolute: 0 10*3/uL (ref 0.0–0.1)
Basophils Relative: 0 %
EOS PCT: 2 %
Eosinophils Absolute: 0.1 10*3/uL (ref 0.0–0.5)
HCT: 34.9 % (ref 34.8–46.6)
Hemoglobin: 11.7 g/dL (ref 11.6–15.9)
LYMPHS ABS: 3.1 10*3/uL (ref 0.9–3.3)
LYMPHS PCT: 47 %
MCH: 29.3 pg (ref 25.1–34.0)
MCHC: 33.5 g/dL (ref 31.5–36.0)
MCV: 87.5 fL (ref 79.5–101.0)
MONO ABS: 0.5 10*3/uL (ref 0.1–0.9)
Monocytes Relative: 8 %
Neutro Abs: 2.8 10*3/uL (ref 1.5–6.5)
Neutrophils Relative %: 43 %
PLATELETS: 270 10*3/uL (ref 145–400)
RBC: 3.99 MIL/uL (ref 3.70–5.45)
RDW: 13 % (ref 11.2–14.5)
WBC Count: 6.5 10*3/uL (ref 3.9–10.3)

## 2017-08-18 LAB — VITAMIN B12: VITAMIN B 12: 271 pg/mL (ref 180–914)

## 2017-08-19 ENCOUNTER — Telehealth: Payer: Self-pay | Admitting: Hematology and Oncology

## 2017-08-19 ENCOUNTER — Telehealth: Payer: Self-pay

## 2017-08-19 LAB — IRON AND TIBC
IRON: 39 ug/dL — AB (ref 41–142)
SATURATION RATIOS: 11 % — AB (ref 21–57)
TIBC: 350 ug/dL (ref 236–444)
UIBC: 312 ug/dL

## 2017-08-19 LAB — FERRITIN: Ferritin: 7 ng/mL — ABNORMAL LOW (ref 11–307)

## 2017-08-19 NOTE — Telephone Encounter (Signed)
Tried to call regarding 7/8 and 7/15

## 2017-08-19 NOTE — Telephone Encounter (Signed)
Spoke with patient regarding low iron levels, informed that Dr. Lindi Adie has looked at labs.  He would like to see her as scheduled on July 8 and schedule Feraheme for that day and 1 week later.  Other labs ok and will be discussed at appt.  Patient voiced ok with above and understanding of information.  She had no questions at this time.  LOS was sent to scheduling.  Explained to patient that scheduler would call her with appt times.

## 2017-08-19 NOTE — Telephone Encounter (Signed)
-----   Message from Gardenia Phlegm, NP sent at 08/19/2017 11:35 AM EDT ----- Please call patient and tell her that Dr. Lindi Adie has looked at her labs, and her iron levels are low and he would like to see her as she is scheduled on July 8.  He would also like to set her up for feraheme that day, and then one week later.  Can you tell her and send schedule message? Her other labs were ok and can be reviewed in further detail on 08/25/17 by Dr. Lindi Adie.  Thanks so much. ----- Message ----- From: Interface, Lab In [place] Sent: 08/18/2017   4:12 PM To: Gardenia Phlegm, NP

## 2017-08-25 ENCOUNTER — Inpatient Hospital Stay (HOSPITAL_BASED_OUTPATIENT_CLINIC_OR_DEPARTMENT_OTHER): Payer: 59 | Admitting: Hematology and Oncology

## 2017-08-25 ENCOUNTER — Inpatient Hospital Stay: Payer: 59 | Admitting: Hematology and Oncology

## 2017-08-25 ENCOUNTER — Inpatient Hospital Stay: Payer: 59 | Admitting: *Deleted

## 2017-08-25 ENCOUNTER — Telehealth: Payer: Self-pay | Admitting: Hematology and Oncology

## 2017-08-25 DIAGNOSIS — D259 Leiomyoma of uterus, unspecified: Secondary | ICD-10-CM

## 2017-08-25 DIAGNOSIS — D509 Iron deficiency anemia, unspecified: Secondary | ICD-10-CM

## 2017-08-25 DIAGNOSIS — E538 Deficiency of other specified B group vitamins: Secondary | ICD-10-CM | POA: Diagnosis not present

## 2017-08-25 DIAGNOSIS — N920 Excessive and frequent menstruation with regular cycle: Secondary | ICD-10-CM

## 2017-08-25 MED ORDER — FERUMOXYTOL INJECTION 510 MG/17 ML
510.0000 mg | Freq: Once | INTRAVENOUS | Status: AC
  Administered 2017-08-25: 510 mg via INTRAVENOUS
  Filled 2017-08-25: qty 17

## 2017-08-25 MED ORDER — SODIUM CHLORIDE 0.9 % IV SOLN
Freq: Once | INTRAVENOUS | Status: AC
  Administered 2017-08-25: 15:00:00 via INTRAVENOUS

## 2017-08-25 NOTE — Progress Notes (Signed)
Patient Care Team: Lucille Passy, MD as PCP - General  DIAGNOSIS:  Encounter Diagnoses  Name Primary?  . Iron deficiency anemia, unspecified iron deficiency anemia type   . B12 deficiency    CHIEF COMPLIANT: Follow-up of iron deficiency anemia and B12 deficiency  INTERVAL HISTORY: Rebecca Castillo is a 42 year old with heavy uterine bleeding who is looking forward to undergoing hysterectomy in September and is being followed by [place] for iron deficiency anemia.  She had IV iron infusion 6 months ago.  She had some improvement in energy levels.  Her B12 levels were also low initially and she received 2 dose of B12 along with her iron infusion the previous visit.  She is here to recheck her blood work.  Reports no new problems or concerns.  She does have mild to moderate fatigue.  REVIEW OF SYSTEMS:   Constitutional: Denies fevers, chills or abnormal weight loss Eyes: Denies blurriness of vision Ears, nose, mouth, throat, and face: Denies mucositis or sore throat Respiratory: Denies cough, dyspnea or wheezes Cardiovascular: Denies palpitation, chest discomfort Gastrointestinal:  Denies nausea, heartburn or change in bowel habits Skin: Denies abnormal skin rashes Lymphatics: Denies new lymphadenopathy or easy bruising Neurological:Denies numbness, tingling or new weaknesses Behavioral/Psych: Mood is stable, no new changes  Extremities: No lower extremity edema  All other systems were reviewed with the patient and are negative.  I have reviewed the past medical history, past surgical history, social history and family history with the patient and they are unchanged from previous note.  ALLERGIES:  is allergic to oxycodone.  MEDICATIONS:  Current Outpatient Medications  Medication Sig Dispense Refill  . valACYclovir (VALTREX) 1000 MG tablet Take 1 tablet (1,000 mg total) by mouth 3 (three) times daily. 21 tablet 0   No current facility-administered medications for this visit.      PHYSICAL EXAMINATION: ECOG PERFORMANCE STATUS: 1 - Symptomatic but completely ambulatory  Vitals:   08/25/17 1452  BP: 123/79  Pulse: 60  Resp: 20  Temp: 98.8 F (37.1 C)  SpO2: 94%   Filed Weights   08/25/17 1452  Weight: 179 lb 4.8 oz (81.3 kg)    GENERAL:alert, no distress and comfortable SKIN: skin color, texture, turgor are normal, no rashes or significant lesions EYES: normal, Conjunctiva are pink and non-injected, sclera clear OROPHARYNX:no exudate, no erythema and lips, buccal mucosa, and tongue normal  NECK: supple, thyroid normal size, non-tender, without nodularity LYMPH:  no palpable lymphadenopathy in the cervical, axillary or inguinal LUNGS: clear to auscultation and percussion with normal breathing effort HEART: regular rate & rhythm and no murmurs and no lower extremity edema ABDOMEN:abdomen soft, non-tender and normal bowel sounds MUSCULOSKELETAL:no cyanosis of digits and no clubbing  NEURO: alert & oriented x 3 with fluent speech, no focal motor/sensory deficits EXTREMITIES: No lower extremity edema   LABORATORY DATA:  I have reviewed the data as listed CMP Latest Ref Rng & Units 08/18/2017 01/22/2017 04/18/2015  Glucose 70 - 99 mg/dL 104(H) 88 88  BUN 6 - 20 mg/dL 11 13 17   Creatinine 0.44 - 1.00 mg/dL 0.84 0.73 0.72  Sodium 135 - 145 mmol/L 139 137 136  Potassium 3.5 - 5.1 mmol/L 3.7 3.8 3.6  Chloride 98 - 111 mmol/L 107 104 105  CO2 22 - 32 mmol/L 25 26 26   Calcium 8.9 - 10.3 mg/dL 9.2 8.6 8.9  Total Protein 6.5 - 8.1 g/dL 7.1 6.9 7.2  Total Bilirubin 0.3 - 1.2 mg/dL 0.2(L) 0.3 0.3  Alkaline Phos  38 - 126 U/L 66 60 55  AST 15 - 41 U/L 13(L) 21 20  ALT 0 - 44 U/L 13 22 17     Lab Results  Component Value Date   WBC 6.5 08/18/2017   HGB 11.7 08/18/2017   HCT 34.9 08/18/2017   MCV 87.5 08/18/2017   PLT 270 08/18/2017   NEUTROABS 2.8 08/18/2017    ASSESSMENT & PLAN:  Iron deficiency anemia Patient has had upper endoscopies and  colonoscopies and did not have any clear identified source of blood loss.  She does have heavy menstrual cycles related to fibroids. She is contemplating on getting a hysterectomy.  Patient is intolerant to oral iron therapy. Prior treatment: IV iron 1/11/20192 doses Lab review: CBC: Normal, hemoglobin 11.7 improved from 10.2 Iron studies: Ferritin 7, iron saturation 11%  Patient is getting IV iron therapy today and the next week.  Return in 6 months for follow-up.  Patient is looking forward to September when she is scheduled to undergo a hysterectomy.  B12 deficiency Profound B12 deficiency with a B12 level of 186 mg. anti-intrinsic factor and anti-parietal cell antibodies  were negative.  B12 injections given January 20192 doses. Pernicious anemia work-up negative  No orders of the defined types were placed in this encounter.  The patient has a good understanding of the overall plan. she agrees with it. she will call with any problems that may develop before the next visit here.   Harriette Ohara, MD 08/25/17

## 2017-08-25 NOTE — Assessment & Plan Note (Signed)
Profound B12 deficiency with a B12 level of 186 mg. anti-intrinsic factor and anti-parietal cell antibodies were negative.  B12 injections given January 20192 doses. Recheck in 2 days blood work to see if she needs B12 on a long-standing basis.

## 2017-08-25 NOTE — Telephone Encounter (Signed)
Gave patient avs and calendar of upcoming January appts.

## 2017-08-25 NOTE — Assessment & Plan Note (Signed)
Patient has had upper endoscopies and colonoscopies and did not have any clear identified source of blood loss.  She does have heavy menstrual cycles related to fibroids. She is contemplating on getting a hysterectomy.  Patient is intolerant to oral iron therapy. Prior treatment: IV iron 1/11/20192 doses Lab review:

## 2017-08-25 NOTE — Patient Instructions (Signed)

## 2017-09-01 ENCOUNTER — Inpatient Hospital Stay: Payer: 59

## 2017-09-02 ENCOUNTER — Encounter: Payer: Self-pay | Admitting: Family Medicine

## 2017-09-29 ENCOUNTER — Telehealth: Payer: Self-pay | Admitting: Hematology and Oncology

## 2017-09-29 NOTE — Telephone Encounter (Signed)
Left message for patient to schedule her per 8/8 sch message.

## 2017-10-01 ENCOUNTER — Telehealth: Payer: Self-pay | Admitting: Hematology and Oncology

## 2017-10-01 NOTE — Telephone Encounter (Signed)
LVM for patient regarding upcoming appt per 8/14 sch message

## 2017-10-16 ENCOUNTER — Telehealth: Payer: Self-pay | Admitting: Hematology and Oncology

## 2017-10-16 NOTE — Telephone Encounter (Signed)
Returned pts call to r/s iron infusion appt.

## 2017-10-21 ENCOUNTER — Inpatient Hospital Stay: Payer: 59 | Attending: Hematology and Oncology

## 2017-10-21 VITALS — BP 126/88 | HR 67 | Temp 98.1°F | Resp 18

## 2017-10-21 DIAGNOSIS — D509 Iron deficiency anemia, unspecified: Secondary | ICD-10-CM | POA: Insufficient documentation

## 2017-10-21 MED ORDER — SODIUM CHLORIDE 0.9 % IV SOLN
Freq: Once | INTRAVENOUS | Status: AC
  Administered 2017-10-21: 16:00:00 via INTRAVENOUS
  Filled 2017-10-21: qty 250

## 2017-10-21 MED ORDER — SODIUM CHLORIDE 0.9 % IV SOLN
510.0000 mg | Freq: Once | INTRAVENOUS | Status: AC
  Administered 2017-10-21: 510 mg via INTRAVENOUS
  Filled 2017-10-21: qty 17

## 2017-10-21 NOTE — Patient Instructions (Signed)

## 2017-10-28 ENCOUNTER — Other Ambulatory Visit: Payer: Self-pay | Admitting: Surgery

## 2018-02-18 HISTORY — PX: PARTIAL HYSTERECTOMY: SHX80

## 2018-02-20 ENCOUNTER — Other Ambulatory Visit: Payer: Self-pay

## 2018-02-20 DIAGNOSIS — D509 Iron deficiency anemia, unspecified: Secondary | ICD-10-CM

## 2018-02-23 ENCOUNTER — Inpatient Hospital Stay: Payer: 59 | Attending: Hematology and Oncology

## 2018-03-02 ENCOUNTER — Inpatient Hospital Stay: Payer: 59 | Admitting: Hematology and Oncology

## 2018-03-02 NOTE — Assessment & Plan Note (Signed)
Profound B12 deficiencywith a B12 level of 186 mg. anti-intrinsic factor and anti-parietal cellantibodies  were negative. Received B12 injections x2 in January 2019. Patient has not received any further B12 injections.

## 2018-03-02 NOTE — Assessment & Plan Note (Signed)
Patient has had upper endoscopies and colonoscopies and did not have any clear identified source of blood loss.  She does have heavy menstrual cycles related to fibroids. She is contemplating on getting a hysterectomy.  Patient is intolerant to oral iron therapy. Prior treatment: IV iron February 2015, August 2015, September 2017, Jan 2019, July 2019, September 2019 only 1 dose Lab review: CBC:

## 2018-06-04 ENCOUNTER — Telehealth: Payer: Self-pay | Admitting: Family Medicine

## 2018-06-04 NOTE — Telephone Encounter (Signed)
Called to see if patient would be interested in a follow up because it has been since 01/2017 since patient has last seen Dr Deborra Medina. Unable to leave message.

## 2018-10-29 LAB — BASIC METABOLIC PANEL
BUN: 16 (ref 4–21)
Creatinine: 0.8 (ref 0.5–1.1)
Glucose: 83
Potassium: 4.2 (ref 3.4–5.3)
Sodium: 138 (ref 137–147)

## 2018-10-29 LAB — HEPATIC FUNCTION PANEL
ALT: 43 — AB (ref 7–35)
AST: 29 (ref 13–35)
Alkaline Phosphatase: 75 (ref 25–125)
Bilirubin, Total: 0.2

## 2018-12-01 ENCOUNTER — Encounter: Payer: Self-pay | Admitting: Family Medicine

## 2018-12-01 NOTE — Progress Notes (Signed)
[facility]/thx dmf 

## 2019-07-27 ENCOUNTER — Ambulatory Visit: Payer: Self-pay | Attending: Internal Medicine

## 2019-07-27 DIAGNOSIS — Z23 Encounter for immunization: Secondary | ICD-10-CM

## 2019-07-27 NOTE — Progress Notes (Signed)
° °  Covid-19 Vaccination Clinic  Name:  Rebecca Castillo    MRN: [id number] DOB: [birth year]  07/27/2019  [name] was observed post Covid-19 immunization for 30 minutes based on pre-vaccination screening without incident. She was provided with Vaccine Information Sheet and instruction to access the V-Safe system.   [name] was instructed to call 911 with any severe reactions post vaccine:  Difficulty breathing   Swelling of face and throat   A fast heartbeat   A bad rash all over body   Dizziness and weakness   Immunizations Administered    Name Date Dose VIS Date Route   Pfizer COVID-19 Vaccine 07/27/2019 10:45 AM 0.3 mL 04/14/2018 Intramuscular   Manufacturer: [place]   Lot: [id number]   [place]: [address]-4

## 2019-08-19 ENCOUNTER — Ambulatory Visit: Payer: Self-pay | Attending: Internal Medicine

## 2019-08-19 DIAGNOSIS — Z23 Encounter for immunization: Secondary | ICD-10-CM

## 2019-08-19 NOTE — Progress Notes (Signed)
   Covid-19 Vaccination Clinic  Name:  Rebecca Castillo    MRN: [id number] DOB: March 17, 1975  08/19/2019  Rebecca Castillo was observed post Covid-19 immunization for 15 minutes without incident. She was provided with Vaccine Information Sheet and instruction to access the V-Safe system.   Rebecca Castillo was instructed to call 911 with any severe reactions post vaccine: Marland Kitchen Difficulty breathing  . Swelling of face and throat  . A fast heartbeat  . A bad rash all over body  . Dizziness and weakness   Immunizations Administered    Name Date Dose VIS Date Route   Pfizer COVID-19 Vaccine 08/19/2019  8:14 AM 0.3 mL 04/14/2018 Intramuscular   Manufacturer: [place]   Lot: [id number]   [place]: [id number]
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
## 2019-09-28 ENCOUNTER — Other Ambulatory Visit: Payer: Self-pay

## 2019-09-28 ENCOUNTER — Encounter: Payer: Self-pay | Admitting: Intensive Care

## 2019-09-28 ENCOUNTER — Emergency Department: Payer: BC Managed Care – PPO

## 2019-09-28 ENCOUNTER — Emergency Department
Admission: EM | Admit: 2019-09-28 | Discharge: 2019-09-28 | Disposition: A | Discharge status: Home / Self Care | Payer: BC Managed Care – PPO | Attending: Emergency Medicine | Admitting: Emergency Medicine

## 2019-09-28 DIAGNOSIS — R109 Unspecified abdominal pain: Secondary | ICD-10-CM | POA: Diagnosis not present

## 2019-09-28 LAB — HEPATIC FUNCTION PANEL
ALT: 21 U/L (ref 0–44)
AST: 17 U/L (ref 15–41)
Albumin: 4.2 g/dL (ref 3.5–5.0)
Alkaline Phosphatase: 56 U/L (ref 38–126)
Bilirubin, Direct: <0.1 mg/dL (ref 0.0–0.2)
Total Bilirubin: 0.8 mg/dL (ref 0.3–1.2)
Total Protein: 7.6 g/dL (ref 6.5–8.1)

## 2019-09-28 LAB — BASIC METABOLIC PANEL
Anion gap: 11 (ref 5–15)
BUN: 16 mg/dL (ref 6–20)
CO2: 25 mmol/L (ref 22–32)
Calcium: 9 mg/dL (ref 8.9–10.3)
Chloride: 102 mmol/L (ref 98–111)
Creatinine, Ser: 0.94 mg/dL (ref 0.44–1.00)
GFR calc Af Amer: >60 mL/min (ref >60)
GFR calc non Af Amer: >60 mL/min (ref >60)
Glucose, Bld: 83 mg/dL (ref 70–99)
Potassium: 4.1 mmol/L (ref 3.5–5.1)
Sodium: 138 mmol/L (ref 135–145)

## 2019-09-28 LAB — URINALYSIS, COMPLETE (UACMP) WITH MICROSCOPIC
Bilirubin Urine: NEGATIVE
Glucose, UA: NEGATIVE mg/dL
Hgb urine dipstick: NEGATIVE
Ketones, ur: NEGATIVE mg/dL
Leukocytes,Ua: NEGATIVE
Nitrite: NEGATIVE
Protein, ur: NEGATIVE mg/dL
Specific Gravity, Urine: 1.027 (ref 1.005–1.030)
pH: 5 (ref 5.0–8.0)

## 2019-09-28 LAB — CBC
HCT: 40.3 % (ref 36.0–46.0)
Hemoglobin: 13.2 g/dL (ref 12.0–15.0)
MCH: 29.3 pg (ref 26.0–34.0)
MCHC: 32.8 g/dL (ref 30.0–36.0)
MCV: 89.6 fL (ref 80.0–100.0)
Platelets: 333 10*3/uL (ref 150–400)
RBC: 4.5 MIL/uL (ref 3.87–5.11)
RDW: 13 % (ref 11.5–15.5)
WBC: 9.2 10*3/uL (ref 4.0–10.5)
nRBC: 0 % (ref 0.0–0.2)

## 2019-09-28 LAB — LIPASE, BLOOD: Lipase: 33 U/L (ref 11–51)

## 2019-09-28 NOTE — ED Provider Notes (Signed)
[facility] Emergency Department Provider Note  ____________________________________________   First MD Initiated Contact with Patient 09/28/19 1924     (approximate)  I have reviewed the triage vital signs and the nursing notes.   HISTORY  Chief Complaint Flank Pain    HPI Rebecca Castillo is a 44 y.o. female presents emergency department complaint of right-sided flank pain on and off for 1 month.  States it is worsened over the last few days.  States it feels like it stuck up under the right rib.  She denies any nausea or vomiting.  No UTI symptoms.  No fever or chills.  Patient states that the pain will go away if she takes ibuprofen however the last few days it has not completely taken the pain away.    Past Medical History:  Diagnosis Date  . Iron deficiency anemia    received Feraheme infusion 04/09/2013  . PONV (postoperative nausea and vomiting)   . Umbilical hernia 07/5782    Patient Active Problem List   Diagnosis Date Noted  . B12 deficiency 02/24/2017  . Paresthesia 01/20/2017  . RLS (restless legs syndrome) 01/20/2017  . Foul smelling urine 04/29/2014  . Arthritis 04/01/2013  . Family history of early CAD 07/01/2011  . Umbilical hernia 69/62/9528  . UNSPECIFIED GENITAL HERPES 03/19/2010  . Iron deficiency anemia 03/19/2010    Past Surgical History:  Procedure Laterality Date  . CESAREAN SECTION     x 4  . MULTIPLE TOOTH EXTRACTIONS      Prior to Admission medications   Medication Sig Start Date End Date Taking? Authorizing Provider  valACYclovir (VALTREX) 1000 MG tablet Take 1 tablet (1,000 mg total) by mouth 3 (three) times daily. 08/12/17   Raylene Everts, MD    Allergies Oxycodone  Family History  Problem Relation Age of Onset  . Heart disease Mother   . Cancer Maternal Grandmother        breast, lung    Social History Social History   Tobacco Use  . Smoking status: Never Smoker  . Smokeless tobacco: Never  Used  Substance Use Topics  . Alcohol use: Yes    Comment: occasionally  . Drug use: No    Review of Systems  Constitutional: No fever/chills Eyes: No visual changes. ENT: No sore throat. Respiratory: Denies cough Cardiovascular: Denies chest pain Gastrointestinal: Positive abdominal pain Genitourinary: Negative for dysuria. Musculoskeletal: Negative for back pain. Skin: Negative for rash. Psychiatric: no mood changes,     ____________________________________________   PHYSICAL EXAM:  VITAL SIGNS: ED Triage Vitals [09/28/19 1626]  Enc Vitals Group     BP (!) 155/94     Pulse Rate (!) 49     Resp 18     Temp 98.9 F (37.2 C)     Temp Source Oral     SpO2 100 %     Weight 179 lb (81.2 kg)     Height 5\' 7"  (1.702 m)     Head Circumference      Peak Flow      Pain Score 6     Pain Loc      Pain Edu?      Excl. in [place]?     Constitutional: Alert and oriented. Well appearing and in no acute distress. Eyes: Conjunctivae are normal.  Head: Atraumatic. Nose: No congestion/rhinnorhea. Mouth/Throat: Mucous membranes are moist.   Neck:  supple no lymphadenopathy noted Cardiovascular: Normal rate, regular rhythm. Heart sounds are normal Respiratory: Normal respiratory  effort.  No retractions, lungs c t a  Abd: soft tender in the right upper and right lower quadrant, bs normal all 4 quad GU: deferred Musculoskeletal: FROM all extremities, warm and well perfused Neurologic:  Normal speech and language.  Skin:  Skin is warm, dry and intact. No rash noted. Psychiatric: Mood and affect are normal. Speech and behavior are normal.  ____________________________________________   LABS (all labs ordered are listed, but only abnormal results are displayed)  Labs Reviewed  URINALYSIS, COMPLETE (UACMP) WITH MICROSCOPIC - Abnormal; Notable for the following components:      Result Value   Color, Urine YELLOW (*)    APPearance HAZY (*)    Bacteria, UA RARE (*)    All other  components within normal limits  CBC  BASIC METABOLIC PANEL  HEPATIC FUNCTION PANEL  LIPASE, BLOOD   ____________________________________________   ____________________________________________  RADIOLOGY  CT renal stone study is negative for any acute abnormality  ____________________________________________   PROCEDURES  Procedure(s) performed: No  Procedures    ____________________________________________   INITIAL IMPRESSION / ASSESSMENT AND PLAN / ED COURSE  Pertinent labs & imaging results that were available during my care of the patient were reviewed by me and considered in my medical decision making (see chart for details).   Patient's 44 year old female presents with right flank pain.  See HPI  Physical exam shows patient be tender in the right upper quadrant and the right lower quadrant.  Remainder exam is unremarkable  DDx: Kidney stone, acute cholecystitis, appendicitis, mass  CBC, basic metabolic panel, hepatic panel, lipase, and urinalysis are all normal.  CT renal stone study   CT is negative for any acute abnormality.  Exam results were discussed with patient.  She is to follow-up with her regular doctor if not improving in 2 to 3 days.  Return emergency department worsening.  Take over-the-counter Tylenol or ibuprofen for pain as needed.  She states she understands.  She was discharged stable condition.  Rebecca Castillo was evaluated in Emergency Department on 09/28/2019 for the symptoms described in the history of present illness. She was evaluated in the context of the global COVID-19 pandemic, which necessitated consideration that the patient might be at risk for infection with the SARS-CoV-2 virus that causes COVID-19. Institutional protocols and algorithms that pertain to the evaluation of patients at risk for COVID-19 are in a state of rapid change based on information released by regulatory bodies including the CDC and federal and state  organizations. These policies and algorithms were followed during the patient's care in the ED.    As part of my medical decision making, I reviewed the following data within the [place] notes reviewed and incorporated, Labs reviewed , Old chart reviewed, Radiograph reviewed , Notes from prior ED visits and [place] Controlled Substance Database  ____________________________________________   FINAL CLINICAL IMPRESSION(S) / ED DIAGNOSES  Final diagnoses:  Flank pain      NEW MEDICATIONS STARTED DURING THIS VISIT:  New Prescriptions   No medications on file     Note:  This document was prepared using Dragon voice recognition software and may include unintentional dictation errors.    Versie Starks, PA-C 09/28/19 2007    Vladimir Crofts, MD 09/28/19 [phone]

## 2019-09-28 NOTE — Discharge Instructions (Addendum)
Follow-up with your regular doctor if not improving in 3 to 4 days.  Return emergency department if worsening.  Take Tylenol and ibuprofen for pain as needed.

## 2019-09-28 NOTE — ED Triage Notes (Signed)
C/o right sided flank pain X1 month that has worsened over the last few days. Has been taking ibuprofen and tylenol with pain subsiding but then comes back. Denies urinary symptoms

## 2019-09-28 NOTE — ED Notes (Signed)
Patient declined discharge vital signs. 

## 2019-11-15 ENCOUNTER — Ambulatory Visit: Payer: Self-pay | Admitting: Family Medicine

## 2019-11-15 DIAGNOSIS — Z0289 Encounter for other administrative examinations: Secondary | ICD-10-CM

## 2020-02-29 ENCOUNTER — Other Ambulatory Visit: Payer: Self-pay

## 2020-02-29 ENCOUNTER — Emergency Department
Admission: EM | Admit: 2020-02-29 | Discharge: 2020-02-29 | Disposition: A | Discharge status: Home / Self Care | Payer: BC Managed Care – PPO | Attending: Emergency Medicine | Admitting: Emergency Medicine

## 2020-02-29 DIAGNOSIS — M791 Myalgia, unspecified site: Secondary | ICD-10-CM | POA: Insufficient documentation

## 2020-02-29 DIAGNOSIS — Z5321 Procedure and treatment not carried out due to patient leaving prior to being seen by health care provider: Secondary | ICD-10-CM | POA: Diagnosis not present

## 2020-02-29 DIAGNOSIS — R1110 Vomiting, unspecified: Secondary | ICD-10-CM | POA: Diagnosis not present

## 2020-02-29 DIAGNOSIS — R509 Fever, unspecified: Secondary | ICD-10-CM | POA: Insufficient documentation

## 2020-02-29 NOTE — ED Notes (Signed)
No answer when called several times from lobby 

## 2020-02-29 NOTE — ED Triage Notes (Signed)
Pt in with co fever, body aches, and vomiting x 2 today.

## 2021-03-21 DIAGNOSIS — Z124 Encounter for screening for malignant neoplasm of cervix: Secondary | ICD-10-CM | POA: Diagnosis not present

## 2021-03-21 DIAGNOSIS — Z01419 Encounter for gynecological examination (general) (routine) without abnormal findings: Secondary | ICD-10-CM | POA: Diagnosis not present

## 2021-03-21 DIAGNOSIS — Z1231 Encounter for screening mammogram for malignant neoplasm of breast: Secondary | ICD-10-CM | POA: Diagnosis not present

## 2021-03-21 DIAGNOSIS — Z6830 Body mass index (BMI) 30.0-30.9, adult: Secondary | ICD-10-CM | POA: Diagnosis not present

## 2021-08-06 ENCOUNTER — Ambulatory Visit: Payer: BC Managed Care – PPO | Admitting: Family

## 2021-09-18 ENCOUNTER — Ambulatory Visit: Payer: BC Managed Care – PPO | Admitting: Family

## 2021-09-18 ENCOUNTER — Encounter: Payer: Self-pay | Admitting: Family

## 2021-09-18 VITALS — BP 122/89 | HR 71 | Temp 98.3°F | Resp 16 | Ht 67.0 in | Wt 199.5 lb

## 2021-09-18 DIAGNOSIS — D509 Iron deficiency anemia, unspecified: Secondary | ICD-10-CM

## 2021-09-18 DIAGNOSIS — R002 Palpitations: Secondary | ICD-10-CM | POA: Diagnosis not present

## 2021-09-18 DIAGNOSIS — B009 Herpesviral infection, unspecified: Secondary | ICD-10-CM

## 2021-09-18 DIAGNOSIS — E538 Deficiency of other specified B group vitamins: Secondary | ICD-10-CM

## 2021-09-18 DIAGNOSIS — G2581 Restless legs syndrome: Secondary | ICD-10-CM | POA: Diagnosis not present

## 2021-09-18 DIAGNOSIS — Z8249 Family history of ischemic heart disease and other diseases of the circulatory system: Secondary | ICD-10-CM | POA: Insufficient documentation

## 2021-09-18 DIAGNOSIS — Z13220 Encounter for screening for lipoid disorders: Secondary | ICD-10-CM | POA: Insufficient documentation

## 2021-09-18 NOTE — Progress Notes (Signed)
New Patient Office Visit  Subjective:  Patient ID: Rebecca Castillo, female    DOB: [birth year]  Age: 46 y.o. MRN: [id number]  CC:  Chief Complaint  Patient presents with   Establish Care    HPI Rebecca Castillo is here to establish care as a new patient.  Prior provider was: Dr. Deborra Medina  Pt is with acute concerns.   Pap , this year 2023, negative per pt. With gyn Dr. Gaetano Net.   About one month ago was under a lot of stress. Around July 3rd 2022, started with a cramp in her shoulder blade that was intermittent. Started to get more sharp over time and couldn't take a deep breath. If she did get a deep breath the pain would be so sharp that it would bring tears to her eyes. Pain lasted for about one hour until it went away. A few days later she felt soreness in the area of pain at work. She has not had the sharp pain since, but it did scare her a bit because her mom with heart disease. She did take a bayer during the episode that she had on hand which also helped.    No chest pain  Occasional flutter on left side of chest that causes her to catch her breath.    chronic concerns:  HSV 2: valtrex prn, only has about 1 outbreak every three years.   Past Medical History:  Diagnosis Date   Family history of early CAD 07/01/2011   Iron deficiency anemia    received Feraheme infusion 04/09/2013   PONV (postoperative nausea and vomiting)    Umbilical hernia 05/6268    Past Surgical History:  Procedure Laterality Date   CESAREAN SECTION     x 4   MULTIPLE TOOTH EXTRACTIONS     abscesses , wisdom teeth as well   PARTIAL HYSTERECTOMY     still with tubes and ovaries    Family History  Problem Relation Age of Onset   Kidney disease Mother    Hypertension Mother    Hyperlipidemia Mother    Heart disease Mother    Heart attack Mother        51   Stroke Mother    Healthy Father        murder   Lung cancer Maternal Grandmother    Throat cancer Maternal Grandmother     Social  History   Socioeconomic History   Marital status: Single    Spouse name: Not on file   Number of children: Not on file   Years of education: Not on file   Highest education level: Not on file  Occupational History    Employer: [employer]    Comment: supervisor  Tobacco Use   Smoking status: Never   Smokeless tobacco: Never  Vaping Use   Vaping Use: Never used  Substance and Sexual Activity   Alcohol use: Yes    Comment: occasionally   Drug use: No   Sexual activity: Yes    Partners: Male    Birth control/protection: Surgical  Other Topics Concern   Not on file  Social History Narrative   Two boys two girls    Four c sections   Social Determinants of Health   Financial Resource Strain: Not on file  Food Insecurity: Not on file  Transportation Needs: Not on file  Physical Activity: Not on file  Stress: Not on file  Social Connections: Not on file  Intimate Partner Violence: Not on file  Outpatient Medications Prior to Visit  Medication Sig Dispense Refill   valACYclovir (VALTREX) 1000 MG tablet Take 1 tablet (1,000 mg total) by mouth 3 (three) times daily. 21 tablet 0   No facility-administered medications prior to visit.    Allergies  Allergen Reactions   Oxycodone Other (See Comments)    DIZZINESS       Objective:    Physical Exam  Gen: NAD, resting comfortably CV: RRR with no murmurs appreciated Pulm: NWOB, CTAB with no crackles, wheezes, or rhonchi Skin: warm, dry Psych: Normal affect and thought content  BP 122/89   Pulse 71   Temp 98.3 F (36.8 C)   Resp 16   Ht '5\' 7"'$  (1.702 m)   Wt 199 lb 8 oz (90.5 kg)   LMP 10/28/2016   SpO2 98%   BMI 31.25 kg/m  Wt Readings from Last 3 Encounters:  09/18/21 199 lb 8 oz (90.5 kg)  02/29/20 183 lb (83 kg)  09/28/19 179 lb (81.2 kg)     Health Maintenance Due  Topic Date Due   Hepatitis C Screening  Never done   PAP SMEAR-Modifier  05/03/2017   COLONOSCOPY (Pts 45-60yr Insurance  coverage will need to be confirmed)  Never done   COVID-19 Vaccine (3 - Pfizer risk series) 09/16/2019   TETANUS/TDAP  06/30/2021   INFLUENZA VACCINE  09/18/2021    There are no preventive care reminders to display for this patient.  Lab Results  Component Value Date   TSH 0.74 01/22/2017   Lab Results  Component Value Date   WBC 9.2 09/28/2019   HGB 13.2 09/28/2019   HCT 40.3 09/28/2019   MCV 89.6 09/28/2019   PLT 333 09/28/2019   Lab Results  Component Value Date   NA 138 09/28/2019   K 4.1 09/28/2019   CHLORIDE 109 04/09/2013   CO2 25 09/28/2019   GLUCOSE 83 09/28/2019   BUN 16 09/28/2019   CREATININE 0.94 09/28/2019   BILITOT 0.8 09/28/2019   ALKPHOS 56 09/28/2019   AST 17 09/28/2019   ALT 21 09/28/2019   PROT 7.6 09/28/2019   ALBUMIN 4.2 09/28/2019   CALCIUM 9.0 09/28/2019   ANIONGAP 11 09/28/2019   GFR 111.97 01/22/2017   Lab Results  Component Value Date   CHOL 150 04/29/2014   Lab Results  Component Value Date   HDL 35.60 (L) 04/29/2014   Lab Results  Component Value Date   LDLCALC 102 (H) 04/29/2014   Lab Results  Component Value Date   TRIG 60.0 04/29/2014   Lab Results  Component Value Date   CHOLHDL 4 04/29/2014   Lab Results  Component Value Date   HGBA1C 5.8 01/22/2017      Assessment & Plan:   Problem List Items Addressed This Visit       Other   Iron deficiency anemia    Order cbc with ibc ferritin  Pending results       Relevant Orders   CBC with Differential   IBC + Ferritin   B12 and Folate Panel   RLS (restless legs syndrome)    Check b12 today  Stretch before bedtime Can always consider requip and or other medication prn       Relevant Orders   TSH   B12 and Folate Panel   B12 deficiency    Check b12  Start otc 1000 mcg b12       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    Valtrex needed  prn       Family history of heart disease in female family member before age 59    Referral to  cardiology for eval /treat due to fmh mom with MI <65 y/o  Pt with ongoing palpitations  Advised to limit caffeine intake       Relevant Orders   Ambulatory referral to Cardiology   EKG 12-Lead (Completed)   Palpitations    ekg today , NSR left axis deviation  Limit caffeine Work on anxiety reducing techniques        Relevant Orders   Ambulatory referral to Cardiology   Comprehensive metabolic panel   EKG 23-RAQT (Completed)   Screening for lipoid disorders    Lipid panel ordered pending results.         Relevant Orders   Lipid panel    No orders of the defined types were placed in this encounter.   Follow-up: No follow-ups on file.    Eugenia Pancoast, FNP

## 2021-09-18 NOTE — Progress Notes (Signed)
Nsr  Slight left axis  Referred to cardiology for workup with fmh heart attack with mom <47 y/o  D/w pt in office

## 2021-09-18 NOTE — Assessment & Plan Note (Signed)
Check b12 today  Stretch before bedtime Can always consider requip and or other medication prn

## 2021-09-18 NOTE — Assessment & Plan Note (Addendum)
ekg today , NSR left axis deviation  Limit caffeine Work on anxiety reducing techniques

## 2021-09-18 NOTE — Assessment & Plan Note (Signed)
Order cbc with ibc ferritin  Pending results

## 2021-09-18 NOTE — Patient Instructions (Signed)
Start 1000 mcg vitamin B12 daily over the counter.   Welcome to our clinic, I am happy to have you as my new patient. I am excited to continue on this healthcare journey with you.  Stop by the lab prior to leaving today. I will notify you of your results once received.   Please keep in mind Any my chart messages you send have up to a three business day turnaround for a response.  Phone calls may take up to a one full business day turnaround for a  response.   If you need a medication refill I recommend you request it through the pharmacy as this is easiest for [place] rather than sending a message and or phone call.   Due to recent changes in healthcare laws, you may see results of your imaging and/or laboratory studies on MyChart before I have had a chance to review them.  I understand that in some cases there may be results that are confusing or concerning to you. Please understand that not all results are received at the same time and often I may need to interpret multiple results in order to provide you with the best plan of care or course of treatment. Therefore, I ask that you please give me 2 business days to thoroughly review all your results before contacting my office for clarification. Should we see a critical lab result, you will be contacted sooner.   It was a pleasure seeing you today! Please do not hesitate to reach out with any questions and or concerns.  Regards,   Eugenia Pancoast FNP-C

## 2021-09-18 NOTE — Assessment & Plan Note (Signed)
Valtrex needed prn

## 2021-09-18 NOTE — Assessment & Plan Note (Signed)
Referral to cardiology for eval /treat due to fmh mom with MI <47 y/o  Pt with ongoing palpitations  Advised to limit caffeine intake

## 2021-09-18 NOTE — Assessment & Plan Note (Signed)
Lipid panel ordered pending results.   

## 2021-09-18 NOTE — Assessment & Plan Note (Signed)
Check b12  Start otc 1000 mcg b12

## 2021-09-19 LAB — CBC WITH DIFFERENTIAL/PLATELET
Basophils Absolute: 0.1 10*3/uL (ref 0.0–0.1)
Basophils Relative: 0.7 % (ref 0.0–3.0)
Eosinophils Absolute: 0.1 10*3/uL (ref 0.0–0.7)
Eosinophils Relative: 1.2 % (ref 0.0–5.0)
HCT: 39.6 % (ref 36.0–46.0)
Hemoglobin: 13.3 g/dL (ref 12.0–15.0)
Lymphocytes Relative: 36.2 % (ref 12.0–46.0)
Lymphs Abs: 2.7 10*3/uL (ref 0.7–4.0)
MCHC: 33.4 g/dL (ref 30.0–36.0)
MCV: 87.8 fl (ref 78.0–100.0)
Monocytes Absolute: 0.6 10*3/uL (ref 0.1–1.0)
Monocytes Relative: 8.1 % (ref 3.0–12.0)
Neutro Abs: 4 10*3/uL (ref 1.4–7.7)
Neutrophils Relative %: 53.8 % (ref 43.0–77.0)
Platelets: 311 10*3/uL (ref 150.0–400.0)
RBC: 4.52 Mil/uL (ref 3.87–5.11)
RDW: 13.5 % (ref 11.5–15.5)
WBC: 7.4 10*3/uL (ref 4.0–10.5)

## 2021-09-19 LAB — COMPREHENSIVE METABOLIC PANEL
ALT: 14 U/L (ref 0–35)
AST: 14 U/L (ref 0–37)
Albumin: 4.4 g/dL (ref 3.5–5.2)
Alkaline Phosphatase: 64 U/L (ref 39–117)
BUN: 11 mg/dL (ref 6–23)
CO2: 27 mEq/L (ref 19–32)
Calcium: 9.1 mg/dL (ref 8.4–10.5)
Chloride: 102 mEq/L (ref 96–112)
Creatinine, Ser: 0.87 mg/dL (ref 0.40–1.20)
GFR: 79.28 mL/min (ref >60.00)
Glucose, Bld: 79 mg/dL (ref 70–99)
Potassium: 3.7 mEq/L (ref 3.5–5.1)
Sodium: 134 mEq/L — ABNORMAL LOW (ref 135–145)
Total Bilirubin: 0.4 mg/dL (ref 0.2–1.2)
Total Protein: 7.3 g/dL (ref 6.0–8.3)

## 2021-09-19 LAB — LIPID PANEL
Cholesterol: 192 mg/dL (ref 0–200)
HDL: 35.4 mg/dL — ABNORMAL LOW (ref >39.00)
LDL Cholesterol: 134 mg/dL — ABNORMAL HIGH (ref 0–99)
NonHDL: 156.89
Total CHOL/HDL Ratio: 5
Triglycerides: 112 mg/dL (ref 0.0–149.0)
VLDL: 22.4 mg/dL (ref 0.0–40.0)

## 2021-09-19 LAB — B12 AND FOLATE PANEL
Folate: 8 ng/mL (ref >5.9)
Vitamin B-12: 155 pg/mL — ABNORMAL LOW (ref 211–911)

## 2021-09-19 LAB — IBC + FERRITIN
Ferritin: 67.2 ng/mL (ref 10.0–291.0)
Iron: 64 ug/dL (ref 42–145)
Saturation Ratios: 17.1 % — ABNORMAL LOW (ref 20.0–50.0)
TIBC: 375.2 ug/dL (ref 250.0–450.0)
Transferrin: 268 mg/dL (ref 212.0–360.0)

## 2021-09-19 LAB — TSH: TSH: 0.79 u[IU]/mL (ref 0.35–5.50)

## 2021-09-19 NOTE — Progress Notes (Signed)
Sodium mildly low but only by one point.  B12 very low Vitamin B12 very low, please set up for B12 injections, 1000 mcg IM once monthly for three months, also schedule 3 month f/u appt to repeat labs and f/u in office. Recommend also oral otc 1000 mcg once daily vitamin B12  Work on low chol diet No anemia Thyroid good

## 2021-09-26 ENCOUNTER — Telehealth: Payer: Self-pay | Admitting: Family

## 2021-09-26 NOTE — Telephone Encounter (Signed)
Patient called in returning a call she received from [place].

## 2021-09-27 NOTE — Telephone Encounter (Signed)
Called pt and mailbox is full unable to leave a message at this time.

## 2021-09-27 NOTE — Telephone Encounter (Signed)
Patient returned call, best number is [phone]

## 2021-09-28 NOTE — Telephone Encounter (Signed)
Called and spoke to pt about lab results. 

## 2021-10-03 ENCOUNTER — Ambulatory Visit (INDEPENDENT_AMBULATORY_CARE_PROVIDER_SITE_OTHER): Payer: BC Managed Care – PPO

## 2021-10-03 DIAGNOSIS — E538 Deficiency of other specified B group vitamins: Secondary | ICD-10-CM | POA: Diagnosis not present

## 2021-10-03 MED ORDER — CYANOCOBALAMIN 1000 MCG/ML IJ SOLN
1000.0000 ug | Freq: Once | INTRAMUSCULAR | Status: AC
  Administered 2021-10-03: 1000 ug via INTRAMUSCULAR

## 2021-10-03 NOTE — Progress Notes (Signed)
Patient presented for B 12 injection given by Taraneh Metheney, CMA to right deltoid, patient voiced no concerns nor showed any signs of distress during injection.  

## 2021-11-15 ENCOUNTER — Ambulatory Visit: Payer: BC Managed Care – PPO | Attending: Cardiology | Admitting: Cardiology

## 2021-12-11 ENCOUNTER — Ambulatory Visit: Payer: BC Managed Care – PPO | Admitting: Family

## 2021-12-11 ENCOUNTER — Ambulatory Visit
Admission: RE | Admit: 2021-12-11 | Discharge: 2021-12-11 | Disposition: A | Discharge status: Home / Self Care | Payer: BC Managed Care – PPO | Source: Ambulatory Visit | Attending: Family | Admitting: Family

## 2021-12-11 ENCOUNTER — Ambulatory Visit: Admission: RE | Admit: 2021-12-11 | Payer: BC Managed Care – PPO | Source: Ambulatory Visit

## 2021-12-11 ENCOUNTER — Encounter: Payer: Self-pay | Admitting: Family

## 2021-12-11 ENCOUNTER — Other Ambulatory Visit: Payer: Self-pay | Admitting: Family

## 2021-12-11 VITALS — BP 130/88 | HR 62 | Temp 98.6°F | Resp 16 | Ht 67.0 in | Wt 199.1 lb

## 2021-12-11 DIAGNOSIS — Z90710 Acquired absence of both cervix and uterus: Secondary | ICD-10-CM | POA: Diagnosis not present

## 2021-12-11 DIAGNOSIS — R1031 Right lower quadrant pain: Secondary | ICD-10-CM | POA: Diagnosis not present

## 2021-12-11 LAB — POC URINALSYSI DIPSTICK (AUTOMATED)
Bilirubin, UA: NEGATIVE
Blood, UA: NEGATIVE
Glucose, UA: NEGATIVE
Ketones, UA: NEGATIVE
Leukocytes, UA: NEGATIVE
Nitrite, UA: NEGATIVE
Protein, UA: NEGATIVE
Spec Grav, UA: 1.01 (ref 1.010–1.025)
Urobilinogen, UA: 0.2 E.U./dL
pH, UA: 7 (ref 5.0–8.0)

## 2021-12-11 LAB — CBC WITH DIFFERENTIAL/PLATELET
Basophils Absolute: 0 10*3/uL (ref 0.0–0.1)
Basophils Relative: 0.3 % (ref 0.0–3.0)
Eosinophils Absolute: 0.1 10*3/uL (ref 0.0–0.7)
Eosinophils Relative: 1.3 % (ref 0.0–5.0)
HCT: 39.3 % (ref 36.0–46.0)
Hemoglobin: 13 g/dL (ref 12.0–15.0)
Lymphocytes Relative: 37.1 % (ref 12.0–46.0)
Lymphs Abs: 2.4 10*3/uL (ref 0.7–4.0)
MCHC: 33.2 g/dL (ref 30.0–36.0)
MCV: 87.1 fl (ref 78.0–100.0)
Monocytes Absolute: 0.5 10*3/uL (ref 0.1–1.0)
Monocytes Relative: 8.2 % (ref 3.0–12.0)
Neutro Abs: 3.4 10*3/uL (ref 1.4–7.7)
Neutrophils Relative %: 53.1 % (ref 43.0–77.0)
Platelets: 314 10*3/uL (ref 150.0–400.0)
RBC: 4.51 Mil/uL (ref 3.87–5.11)
RDW: 13.3 % (ref 11.5–15.5)
WBC: 6.3 10*3/uL (ref 4.0–10.5)

## 2021-12-11 LAB — BASIC METABOLIC PANEL
BUN: 10 mg/dL (ref 6–23)
CO2: 28 mEq/L (ref 19–32)
Calcium: 8.7 mg/dL (ref 8.4–10.5)
Chloride: 103 mEq/L (ref 96–112)
Creatinine, Ser: 0.85 mg/dL (ref 0.40–1.20)
GFR: 81.39 mL/min (ref >60.00)
Glucose, Bld: 94 mg/dL (ref 70–99)
Potassium: 3.8 mEq/L (ref 3.5–5.1)
Sodium: 137 mEq/L (ref 135–145)

## 2021-12-11 MED ORDER — IOPAMIDOL (ISOVUE-300) INJECTION 61%
100.0000 mL | Freq: Once | INTRAVENOUS | Status: AC | PRN
  Administered 2021-12-11: 100 mL via INTRAVENOUS

## 2021-12-11 NOTE — Progress Notes (Signed)
Cbc and cmp without acute findings.

## 2021-12-11 NOTE — Progress Notes (Signed)
No acute findings seen on CT abd pelvis. No colon thickening, no appendix issue. No inflammation with appendix. Let's await urine sample.

## 2021-12-11 NOTE — Progress Notes (Addendum)
Established Patient Office Visit  Subjective:  Patient ID: Rebecca Castillo, female    DOB: Jun 16, 1975  Age: 46 y.o. MRN: [id number]  CC:  Chief Complaint  Patient presents with   Flank Pain    X 5-6 months     HPI Rebecca Castillo is here today with concerns.   She has had a right sided pain for the last five months, will typically occur on the right lateral side and feels deep inside, and lately it is lasting for a longer period of time.She usually takes an ibuprofen and it will go away, but now ibuprofen doesn't seem to help. Most recent episode started about two days ago, pain is intermittent, states its like a cramping sharp sensation in her right lateral side. She seems to know when it is going to com eon because it comes on really easy but then will have sharp pain for a few seconds and then go away.   Bowel movements regular for her.  No nausea not vomiting.  No fever or chills No change in urinary symptoms.   Had partial hysterectomy, ovaries still in place. No longer with menses.     Past Medical History:  Diagnosis Date   Family history of early CAD 07/01/2011   Iron deficiency anemia    received Feraheme infusion 04/09/2013   PONV (postoperative nausea and vomiting)    Umbilical hernia 06/2776    Past Surgical History:  Procedure Laterality Date   CESAREAN SECTION     x 4   MULTIPLE TOOTH EXTRACTIONS     abscesses , wisdom teeth as well   PARTIAL HYSTERECTOMY     still with tubes and ovaries    Family History  Problem Relation Age of Onset   Kidney disease Mother    Hypertension Mother    Hyperlipidemia Mother    Heart disease Mother    Heart attack Mother        33   Stroke Mother    Healthy Father        murder   Lung cancer Maternal Grandmother    Throat cancer Maternal Grandmother     Social History   Socioeconomic History   Marital status: Single    Spouse name: Not on file   Number of children: Not on file   Years of education: Not on file    Highest education level: Not on file  Occupational History    Employer: [employer]    Comment: supervisor  Tobacco Use   Smoking status: Never   Smokeless tobacco: Never  Vaping Use   Vaping Use: Never used  Substance and Sexual Activity   Alcohol use: Yes    Comment: occasionally   Drug use: No   Sexual activity: Yes    Partners: Male    Birth control/protection: Surgical  Other Topics Concern   Not on file  Social History Narrative   Two boys two girls    Four c sections   Social Determinants of Health   Financial Resource Strain: Not on file  Food Insecurity: Not on file  Transportation Needs: Not on file  Physical Activity: Not on file  Stress: Not on file  Social Connections: Not on file  Intimate Partner Violence: Not on file    Outpatient Medications Prior to Visit  Medication Sig Dispense Refill   valACYclovir (VALTREX) 1000 MG tablet Take 1 tablet (1,000 mg total) by mouth 3 (three) times daily. 21 tablet 0   No facility-administered medications  prior to visit.    Allergies  Allergen Reactions   Oxycodone Other (See Comments)    DIZZINESS        Objective:    Physical Exam Constitutional:      General: She is not in acute distress.    Appearance: Normal appearance. She is obese. She is not ill-appearing, toxic-appearing or diaphoretic.  Pulmonary:     Effort: Pulmonary effort is normal.  Abdominal:     General: Abdomen is flat. Bowel sounds are normal.     Tenderness: There is abdominal tenderness in the right lower quadrant. There is rebound. Positive signs include Rovsing's sign, McBurney's sign, psoas sign and obturator sign.     Comments: Right lower quadrant with palpation 8-9/10 pain     Neurological:     Mental Status: She is alert.     BP 130/88   Pulse 62   Temp 98.6 F (37 C)   Resp 16   Ht '5\' 7"'$  (1.702 m)   Wt 199 lb 2 oz (90.3 kg)   LMP 10/28/2016   SpO2 96%   BMI 31.19 kg/m  Wt Readings from Last 3  Encounters:  12/11/21 199 lb 2 oz (90.3 kg)  09/18/21 199 lb 8 oz (90.5 kg)  02/29/20 183 lb (83 kg)     Health Maintenance Due  Topic Date Due   Hepatitis C Screening  Never done   PAP SMEAR-Modifier  05/03/2017   COLONOSCOPY (Pts 45-30yr Insurance coverage will need to be confirmed)  Never done   COVID-19 Vaccine (3 - Pfizer risk series) 09/16/2019   TETANUS/TDAP  06/30/2021   INFLUENZA VACCINE  Never done    There are no preventive care reminders to display for this patient.  Lab Results  Component Value Date   TSH 0.79 09/18/2021   Lab Results  Component Value Date   WBC 7.4 09/18/2021   HGB 13.3 09/18/2021   HCT 39.6 09/18/2021   MCV 87.8 09/18/2021   PLT 311.0 09/18/2021   Lab Results  Component Value Date   NA 134 (L) 09/18/2021   K 3.7 09/18/2021   CHLORIDE 109 04/09/2013   CO2 27 09/18/2021   GLUCOSE 79 09/18/2021   BUN 11 09/18/2021   CREATININE 0.87 09/18/2021   BILITOT 0.4 09/18/2021   ALKPHOS 64 09/18/2021   AST 14 09/18/2021   ALT 14 09/18/2021   PROT 7.3 09/18/2021   ALBUMIN 4.4 09/18/2021   CALCIUM 9.1 09/18/2021   ANIONGAP 11 09/28/2019   GFR 79.28 09/18/2021   Lab Results  Component Value Date   HGBA1C 5.8 01/22/2017      Assessment & Plan:   Problem List Items Addressed This Visit       Other   Right lower quadrant abdominal pain - Primary    Stat ct abd pelvis r/o appendicitis or bowel inflammation Cbc cmp urine culture poc urine pending results.  Pt advised of red flag symptoms and when to go to ER and or call 911.       Relevant Orders   Urine Culture   CBC with Differential   Basic metabolic panel   CT Abdomen Pelvis W Contrast   POCT Urinalysis Dipstick (Automated) (Completed)    No orders of the defined types were placed in this encounter.   Follow-up: No follow-ups on file.    TEugenia Pancoast FNP

## 2021-12-11 NOTE — Assessment & Plan Note (Signed)
Stat ct abd pelvis r/o appendicitis or bowel inflammation Cbc cmp urine culture poc urine pending results.  Pt advised of red flag symptoms and when to go to ER and or call 911.

## 2021-12-11 NOTE — Progress Notes (Signed)
Ct scan came back without any acute findings. Appendix appears normal.

## 2021-12-13 ENCOUNTER — Telehealth: Payer: Self-pay | Admitting: Family

## 2021-12-13 LAB — URINE CULTURE
MICRO NUMBER:: 14093394
Result:: NO GROWTH
SPECIMEN QUALITY:: ADEQUATE

## 2021-12-13 NOTE — Progress Notes (Signed)
Urine culture negative for UTI

## 2021-12-13 NOTE — Telephone Encounter (Signed)
Patient returned phone call regarding results. I let her know that her urine culture was negative for uti.

## 2021-12-14 NOTE — Telephone Encounter (Signed)
Noted  

## 2021-12-21 ENCOUNTER — Ambulatory Visit: Payer: BC Managed Care – PPO | Attending: Cardiology | Admitting: Cardiology

## 2021-12-25 ENCOUNTER — Encounter: Payer: Self-pay | Admitting: Cardiology

## 2022-02-08 ENCOUNTER — Ambulatory Visit: Payer: BC Managed Care – PPO | Admitting: Family Medicine

## 2022-02-08 VITALS — BP 120/80 | HR 66 | Temp 98.8°F | Ht 67.0 in | Wt 199.5 lb

## 2022-02-08 DIAGNOSIS — E538 Deficiency of other specified B group vitamins: Secondary | ICD-10-CM | POA: Diagnosis not present

## 2022-02-08 DIAGNOSIS — R1011 Right upper quadrant pain: Secondary | ICD-10-CM | POA: Insufficient documentation

## 2022-02-08 LAB — COMPREHENSIVE METABOLIC PANEL
ALT: 11 U/L (ref 0–35)
AST: 11 U/L (ref 0–37)
Albumin: 4.1 g/dL (ref 3.5–5.2)
Alkaline Phosphatase: 60 U/L (ref 39–117)
BUN: 14 mg/dL (ref 6–23)
CO2: 28 mEq/L (ref 19–32)
Calcium: 9.2 mg/dL (ref 8.4–10.5)
Chloride: 103 mEq/L (ref 96–112)
Creatinine, Ser: 0.86 mg/dL (ref 0.40–1.20)
GFR: 80.17 mL/min (ref >60.00)
Glucose, Bld: 104 mg/dL — ABNORMAL HIGH (ref 70–99)
Potassium: 3.8 mEq/L (ref 3.5–5.1)
Sodium: 138 mEq/L (ref 135–145)
Total Bilirubin: 0.9 mg/dL (ref 0.2–1.2)
Total Protein: 6.9 g/dL (ref 6.0–8.3)

## 2022-02-08 LAB — LIPASE: Lipase: 18 U/L (ref 11.0–59.0)

## 2022-02-08 MED ORDER — CYANOCOBALAMIN 1000 MCG/ML IJ SOLN
1000.0000 ug | Freq: Once | INTRAMUSCULAR | Status: AC
  Administered 2022-02-08: 1000 ug via INTRAMUSCULAR

## 2022-02-08 NOTE — Progress Notes (Signed)
Patient ID: Rebecca Castillo, female    DOB: 05-Oct-1975, 46 y.o.   MRN: [id number]  This visit was conducted in person.  BP 120/80   Pulse 66   Temp 98.8 F (37.1 C) (Oral)   Ht _0  (1.702 m)   Wt 199 lb 8 oz (90.5 kg)   LMP 10/28/2016   SpO2 97%   BMI 31.25 kg/m    CC:  Chief Complaint  Patient presents with   RLQ Pain    Seen back in October-CT/Labs normal    Subjective:   HPI: Rebecca Castillo is a 46 y.o. female patient of Eugenia Pancoast, PA presenting on 02/08/2022 for RLQ Pain (Seen back in October-CT/Labs normal)  Reviewed last office visit with PCP for December 11, 2021.  At that point she had reported right-sided pain for the previous 5 months typically occurring on the right lateral side but it has been lasting longer.  Of time each episode.  She had typically used ibuprofen with relief but that it stopped working.  At that time she described her symptoms as intermittent cramping sharp sensation in right lateral side normal BMs, no blood in stool, no nausea or vomiting.  History of partial hysterectomy ovaries still in place  She was evaluated with labs and CT scan.  Urinalysis was clear (urine culture negative), normal basic metabolic panel,  CBC was normal without elevated white blood cell or anemia.  CT 12/11/21 IMPRESSION: Normal appendix.  No renal stones, no adnexal masses, no constipation unremarkable gallbladder.  No acute findings.   Today she reports continued abdominal pain.  She reports aching on RUQ.Marland Kitchen occurring daily.  Needing to take 800 mg ibuprofen daily... helps it for a few hours.   No change in eating, does not eat healthfully but this is not new, drinks soda.  Feels bloated after eating. Hears stomach gurgling at times, slightly. Some increase in passing gas.  No relief with BM. May be worse after fried chicken. Worse with stress.  No skin changes, no bowel changes, no urinary changes.    No back pain.  Relevant past medical, surgical,  family and social history reviewed and updated as indicated. Interim medical history since our last visit reviewed. Allergies and medications reviewed and updated. Outpatient Medications Prior to Visit  Medication Sig Dispense Refill   valACYclovir (VALTREX) 1000 MG tablet Take 1 tablet (1,000 mg total) by mouth 3 (three) times daily. 21 tablet 0   No facility-administered medications prior to visit.     Per HPI unless specifically indicated in ROS section below Review of Systems  Constitutional:  Negative for fatigue and fever.  HENT:  Negative for congestion.   Eyes:  Negative for pain.  Respiratory:  Negative for cough and shortness of breath.   Cardiovascular:  Negative for chest pain, palpitations and leg swelling.  Gastrointestinal:  Positive for abdominal pain. Negative for abdominal distention, anal bleeding, blood in stool, constipation, diarrhea, nausea, rectal pain and vomiting.  Genitourinary:  Negative for dysuria and vaginal bleeding.  Musculoskeletal:  Negative for back pain.  Neurological:  Negative for syncope, light-headedness and headaches.  Psychiatric/Behavioral:  Negative for dysphoric mood.    Objective:  BP 120/80   Pulse 66   Temp 98.8 F (37.1 C) (Oral)   Ht _1  (1.702 m)   Wt 199 lb 8 oz (90.5 kg)   LMP 10/28/2016   SpO2 97%   BMI 31.25 kg/m   Wt Readings from Last 3 Encounters:  02/08/22  199 lb 8 oz (90.5 kg)  12/11/21 199 lb 2 oz (90.3 kg)  09/18/21 199 lb 8 oz (90.5 kg)      Physical Exam Constitutional:      General: She is not in acute distress.    Appearance: Normal appearance. She is well-developed. She is not ill-appearing or toxic-appearing.  HENT:     Head: Normocephalic.     Right Ear: Hearing, tympanic membrane, ear canal and external ear normal. Tympanic membrane is not erythematous, retracted or bulging.     Left Ear: Hearing, tympanic membrane, ear canal and external ear normal. Tympanic membrane is not erythematous, retracted  or bulging.     Nose: No mucosal edema or rhinorrhea.     Right Sinus: No maxillary sinus tenderness or frontal sinus tenderness.     Left Sinus: No maxillary sinus tenderness or frontal sinus tenderness.     Mouth/Throat:     Pharynx: Uvula midline.  Eyes:     General: Lids are normal. Lids are everted, no foreign bodies appreciated.     Conjunctiva/sclera: Conjunctivae normal.     Pupils: Pupils are equal, round, and reactive to light.  Neck:     Thyroid: No thyroid mass or thyromegaly.     Vascular: No carotid bruit.     Trachea: Trachea normal.  Cardiovascular:     Rate and Rhythm: Normal rate and regular rhythm.     Pulses: Normal pulses.     Heart sounds: Normal heart sounds, S1 normal and S2 normal. No murmur heard.    No friction rub. No gallop.  Pulmonary:     Effort: Pulmonary effort is normal. No tachypnea or respiratory distress.     Breath sounds: Normal breath sounds. No decreased breath sounds, wheezing, rhonchi or rales.  Abdominal:     General: Bowel sounds are normal.     Palpations: Abdomen is soft.     Tenderness: There is abdominal tenderness in the right upper quadrant. There is no right CVA tenderness, left CVA tenderness, guarding or rebound. Negative signs include Murphy's sign.     Hernia: No hernia is present.  Musculoskeletal:     Cervical back: Normal range of motion and neck supple.  Skin:    General: Skin is warm and dry.     Findings: No rash.  Neurological:     Mental Status: She is alert.  Psychiatric:        Mood and Affect: Mood is not anxious or depressed.        Speech: Speech normal.        Behavior: Behavior normal. Behavior is cooperative.        Thought Content: Thought content normal.        Judgment: Judgment normal.       Results for orders placed or performed in visit on 12/11/21  Urine Culture   Specimen: Urine  Result Value Ref Range   MICRO NUMBER: [id number]    SPECIMEN QUALITY: Adequate    Sample Source NOT GIVEN     STATUS: FINAL    Result: No Growth   CBC with Differential  Result Value Ref Range   WBC 6.3 4.0 - 10.5 K/uL   RBC 4.51 3.87 - 5.11 Mil/uL   Hemoglobin 13.0 12.0 - 15.0 g/dL   HCT 39.3 36.0 - 46.0 %   MCV 87.1 78.0 - 100.0 fl   MCHC 33.2 30.0 - 36.0 g/dL   RDW 13.3 11.5 - 15.5 %   Platelets 314.0 150.0 -  400.0 K/uL   Neutrophils Relative % 53.1 43.0 - 77.0 %   Lymphocytes Relative 37.1 12.0 - 46.0 %   Monocytes Relative 8.2 3.0 - 12.0 %   Eosinophils Relative 1.3 0.0 - 5.0 %   Basophils Relative 0.3 0.0 - 3.0 %   Neutro Abs 3.4 1.4 - 7.7 K/uL   Lymphs Abs 2.4 0.7 - 4.0 K/uL   Monocytes Absolute 0.5 0.1 - 1.0 K/uL   Eosinophils Absolute 0.1 0.0 - 0.7 K/uL   Basophils Absolute 0.0 0.0 - 0.1 K/uL  Basic metabolic panel  Result Value Ref Range   Sodium 137 135 - 145 mEq/L   Potassium 3.8 3.5 - 5.1 mEq/L   Chloride 103 96 - 112 mEq/L   CO2 28 19 - 32 mEq/L   Glucose, Bld 94 70 - 99 mg/dL   BUN 10 6 - 23 mg/dL   Creatinine, Ser 0.85 0.40 - 1.20 mg/dL   GFR 81.39 >60.00 mL/min   Calcium 8.7 8.4 - 10.5 mg/dL  POCT Urinalysis Dipstick (Automated)  Result Value Ref Range   Color, UA yellow    Clarity, UA clear    Glucose, UA Negative Negative   Bilirubin, UA neg    Ketones, UA neg    Spec Grav, UA 1.010 1.010 - 1.025   Blood, UA neg    pH, UA 7.0 5.0 - 8.0   Protein, UA Negative Negative   Urobilinogen, UA 0.2 0.2 or 1.0 E.U./dL   Nitrite, UA neg    Leukocytes, UA Negative Negative     COVID 19 screen:  No recent travel or known exposure to COVID19 The patient denies respiratory symptoms of COVID 19 at this time. The importance of social distancing was discussed today.   Assessment and Plan     Problem List Items Addressed This Visit     B12 deficiency    Chronic Per her PCP recommended B12 injections of 1 g monthly.  She is overdue for this injection.  Given at today's appointment      RUQ pain - Primary    Chronic, worsening Initial workup including c-Met,  CBC, UA, urine culture and CT scan unremarkable.  In discussion with patient her symptoms are most reflective of possible gallbladder sludge or dysfunction versus irritable bowel syndrome. To be thorough I will check a lipase as well as liver function test given that was not done initially.  I am doubtful this is a liver or pancreas issue given normal CT scan. She had very poor diet and irritable bowel syndrome is likely.  We reviewed low-fat diet she will try to avoid greasy foods and fast food for both possible gallbladder dysfunction and IBS. We will start with an ultrasound of the gallbladder but will likely need to move forward with a HIDA scan to assess gallbladder function. She will go ahead and make dietary changes, increase fiber, increase water and start probiotic for possible pain predominant irritable bowel syndrome.  No clear sign of referred back pain.  No red flags for urgent ER admission.      Relevant Orders   Comprehensive metabolic panel   Lipase       Eliezer Lofts, MD

## 2022-02-08 NOTE — Assessment & Plan Note (Signed)
Chronic, worsening Initial workup including c-Met, CBC, UA, urine culture and CT scan unremarkable.  In discussion with patient her symptoms are most reflective of possible gallbladder sludge or dysfunction versus irritable bowel syndrome. To be thorough I will check a lipase as well as liver function test given that was not done initially.  I am doubtful this is a liver or pancreas issue given normal CT scan. She had very poor diet and irritable bowel syndrome is likely.  We reviewed low-fat diet she will try to avoid greasy foods and fast food for both possible gallbladder dysfunction and IBS. We will start with an ultrasound of the gallbladder but will likely need to move forward with a HIDA scan to assess gallbladder function. She will go ahead and make dietary changes, increase fiber, increase water and start probiotic for possible pain predominant irritable bowel syndrome.  No clear sign of referred back pain.  No red flags for urgent ER admission.

## 2022-02-08 NOTE — Assessment & Plan Note (Signed)
Chronic Per her PCP recommended B12 injections of 1 g monthly.  She is overdue for this injection.  Given at today's appointment

## 2022-02-08 NOTE — Patient Instructions (Signed)
Please stop at the lab to have labs drawn.  We will set up RUQ  [place] to evaluate for gallbladder sludge with plan to possible move on to a HIDA scan for gallbladder function.  Work on decreased fat in diet, less carbonated bevarges, less fast food... increase water and fiber in diet.  Start fiber supplement like Benefiber. Also try a probiotic daily.  Stop ibuprofen.. use tylenol instead.

## 2022-02-12 ENCOUNTER — Telehealth: Payer: Self-pay | Admitting: Family

## 2022-02-12 NOTE — Telephone Encounter (Signed)
Patient returned call regarding her lab results and I read off the message for her regarding them.

## 2022-02-12 NOTE — Telephone Encounter (Signed)
Called patient reviewed all information and repeated back to me. Will call if any questions.  ? ?

## 2022-03-13 ENCOUNTER — Ambulatory Visit (INDEPENDENT_AMBULATORY_CARE_PROVIDER_SITE_OTHER): Payer: BC Managed Care – PPO

## 2022-03-13 DIAGNOSIS — E538 Deficiency of other specified B group vitamins: Secondary | ICD-10-CM | POA: Diagnosis not present

## 2022-03-13 MED ORDER — CYANOCOBALAMIN 1000 MCG/ML IJ SOLN
1000.0000 ug | Freq: Once | INTRAMUSCULAR | Status: AC
  Administered 2022-03-13: 1000 ug via INTRAMUSCULAR

## 2022-03-13 NOTE — Progress Notes (Signed)
Per orders of Allie Bossier, DPN AGNP-C, injection of B-12 given by Barkley Bruns in left deltoid. Patient tolerated injection well.

## 2022-04-01 ENCOUNTER — Ambulatory Visit: Payer: BC Managed Care – PPO | Admitting: Family

## 2022-04-01 ENCOUNTER — Encounter: Payer: Self-pay | Admitting: Family

## 2022-04-01 ENCOUNTER — Ambulatory Visit (INDEPENDENT_AMBULATORY_CARE_PROVIDER_SITE_OTHER): Payer: BC Managed Care – PPO | Admitting: Family

## 2022-04-01 VITALS — BP 122/88 | HR 66 | Temp 98.1°F | Ht 67.0 in | Wt 204.0 lb

## 2022-04-01 DIAGNOSIS — R1031 Right lower quadrant pain: Secondary | ICD-10-CM

## 2022-04-01 DIAGNOSIS — R102 Pelvic and perineal pain: Secondary | ICD-10-CM | POA: Diagnosis not present

## 2022-04-01 DIAGNOSIS — R1011 Right upper quadrant pain: Secondary | ICD-10-CM | POA: Diagnosis not present

## 2022-04-01 NOTE — Assessment & Plan Note (Signed)
Only slight, not today. Intermittent.  Will order abdominal u/s to r/o gallbladder etiology although much less likely.

## 2022-04-01 NOTE — Assessment & Plan Note (Addendum)
U/s abdomen and pelvis pending results  Referral placed for GI at this point chronic  CT abd pelvis negative for acute findings 10/24 Lab workup unremarkable.  Suspect less likely gallbladder due to location of tenderness  R/o ovarian cyst and or fluid accumulation lower quadrant   Did also suggest elimination diet to r/o aggravating factors

## 2022-04-01 NOTE — Progress Notes (Signed)
Established Patient Office Visit  Subjective:      CC:  Chief Complaint  Patient presents with   Medical Management of Chronic Issues   Pain    Pain in the right side is persistent and she would like further imaging to be done as she is concerned about her gallbladder.  Was seen by [facility] in December for this issue as well.     HPI: Rebecca Castillo is a 47 y.o. female presenting on 04/01/2022 for Medical Management of Chronic Issues and Pain (Pain in the right side is persistent and she would like further imaging to be done as she is concerned about her gallbladder. /Was seen by [facility] in December for this issue as well. ) . Still ongoing rlq abdominal pain. Has been going on for over a year at this time.  Intermittent, but daily.  When it occurs dull and achy and still feels deep inside. She will take ibuprofen with some mild relief.   CT abd pelvix 10/24 was wnl   Was seen recently by Dr. Diona Browner 12/22   Still denies any change in bowel movements. No increase gassiness.  No longer with period, had partial hysterectomy 3 years ago.  Recommendations were made for u/s abdomen followed by HIDA scan if no improvement.        Social history:  Relevant past medical, surgical, family and social history reviewed and updated as indicated. Interim medical history since our last visit reviewed.  Allergies and medications reviewed and updated.  DATA REVIEWED: CHART IN EPIC     ROS: Negative unless specifically indicated above in HPI.    Current Outpatient Medications:    valACYclovir (VALTREX) 1000 MG tablet, Take 1 tablet (1,000 mg total) by mouth 3 (three) times daily., Disp: 21 tablet, Rfl: 0      Objective:    BP 122/88   Pulse 66   Temp 98.1 F (36.7 C) (Temporal)   Ht 5' 7"$  (1.702 m)   Wt 204 lb (92.5 kg)   LMP 10/28/2016   SpO2 98%   BMI 31.95 kg/m   Wt Readings from Last 3 Encounters:  04/01/22 204 lb (92.5 kg)  02/08/22 199 lb 8 oz (90.5 kg)   12/11/21 199 lb 2 oz (90.3 kg)    Physical Exam Vitals reviewed.  Constitutional:      General: She is not in acute distress.    Appearance: Normal appearance. She is normal weight. She is not ill-appearing, toxic-appearing or diaphoretic.  HENT:     Head: Normocephalic.  Cardiovascular:     Rate and Rhythm: Normal rate.  Pulmonary:     Effort: Pulmonary effort is normal.  Abdominal:     Tenderness: There is abdominal tenderness in the right lower quadrant and suprapubic area. There is no right CVA tenderness, left CVA tenderness, guarding or rebound. Positive signs include McBurney's sign. Negative signs include Murphy's sign, Rovsing's sign, psoas sign and obturator sign.  Musculoskeletal:        General: Normal range of motion.  Neurological:     General: No focal deficit present.     Mental Status: She is alert and oriented to person, place, and time. Mental status is at baseline.  Psychiatric:        Mood and Affect: Mood normal.        Behavior: Behavior normal.        Thought Content: Thought content normal.        Judgment: Judgment normal.  Assessment & Plan:  Right lower quadrant abdominal pain Assessment & Plan: U/s abdomen and pelvis pending results  Referral placed for GI at this point chronic  CT abd pelvis negative for acute findings 10/24 Lab workup unremarkable.  Suspect less likely gallbladder due to location of tenderness  R/o ovarian cyst and or fluid accumulation lower quadrant   Did also suggest elimination diet to r/o aggravating factors   Orders: -     US PELVIC COMPLETE WITH TRANSVAGINAL; Future -     US ABDOMEN LIMITED RUQ (LIVER/GB); Future -     Ambulatory referral to Gastroenterology  Suprapubic abdominal pain -     US PELVIC COMPLETE WITH TRANSVAGINAL; Future -     US ABDOMEN LIMITED RUQ (LIVER/GB); Future -     Ambulatory referral to Gastroenterology  RUQ pain Assessment & Plan: Only slight, not today. Intermittent.   Will order abdominal u/s to r/o gallbladder etiology although much less likely.       Return in about 3 months (around 06/30/2022) for f/u abdominal pain .  Eugenia Pancoast, MSN, APRN, FNP-C [place]

## 2022-04-01 NOTE — Patient Instructions (Signed)
Call [phone] to schedule your ultrasound  For pelvic and abdominal ultrasound   A referral was placed today for GI.  Please let us know if you have not heard back within 2 weeks about the referral.   Regards,   Hadley Detloff FNP-C

## 2022-04-11 ENCOUNTER — Other Ambulatory Visit: Payer: BC Managed Care – PPO

## 2022-05-08 ENCOUNTER — Ambulatory Visit
Admission: RE | Admit: 2022-05-08 | Discharge: 2022-05-08 | Disposition: A | Discharge status: Home / Self Care | Payer: BC Managed Care – PPO | Source: Ambulatory Visit | Attending: Family | Admitting: Family

## 2022-05-08 ENCOUNTER — Other Ambulatory Visit: Payer: BC Managed Care – PPO

## 2022-05-08 DIAGNOSIS — R1031 Right lower quadrant pain: Secondary | ICD-10-CM

## 2022-05-08 DIAGNOSIS — R102 Pelvic and perineal pain: Secondary | ICD-10-CM

## 2022-05-08 DIAGNOSIS — N83201 Unspecified ovarian cyst, right side: Secondary | ICD-10-CM | POA: Diagnosis not present

## 2022-05-08 DIAGNOSIS — N83202 Unspecified ovarian cyst, left side: Secondary | ICD-10-CM | POA: Diagnosis not present

## 2022-05-08 DIAGNOSIS — Z90710 Acquired absence of both cervix and uterus: Secondary | ICD-10-CM | POA: Diagnosis not present

## 2022-05-09 ENCOUNTER — Ambulatory Visit
Admission: RE | Admit: 2022-05-09 | Discharge: 2022-05-09 | Disposition: A | Discharge status: Home / Self Care | Payer: BC Managed Care – PPO | Source: Ambulatory Visit | Attending: Family | Admitting: Family

## 2022-05-09 ENCOUNTER — Telehealth: Payer: Self-pay | Admitting: Gastroenterology

## 2022-05-09 DIAGNOSIS — R1011 Right upper quadrant pain: Secondary | ICD-10-CM | POA: Diagnosis not present

## 2022-05-09 DIAGNOSIS — R102 Pelvic and perineal pain: Secondary | ICD-10-CM

## 2022-05-09 DIAGNOSIS — R1031 Right lower quadrant pain: Secondary | ICD-10-CM

## 2022-05-09 NOTE — Progress Notes (Signed)
Small ovarian cysts with ovaries, that do not require further imaging. Not- concerning. Not likely cause for lower abdominal pain. At this time, suggest following up with GI. Does pt have GI doctor, if not I will place a referral. Is pain still ongoing?

## 2022-05-09 NOTE — Telephone Encounter (Signed)
Schedule appointment when June schedule opens

## 2022-05-13 NOTE — Progress Notes (Signed)
No acute findings. Fatty liver seen on ultrasound. To help improve fatty liver, work on diet and exercise. This is basically accumulation of fat on the liver.

## 2022-07-04 DIAGNOSIS — Z1231 Encounter for screening mammogram for malignant neoplasm of breast: Secondary | ICD-10-CM | POA: Diagnosis not present

## 2022-07-04 DIAGNOSIS — Z01419 Encounter for gynecological examination (general) (routine) without abnormal findings: Secondary | ICD-10-CM | POA: Diagnosis not present

## 2022-07-04 DIAGNOSIS — N390 Urinary tract infection, site not specified: Secondary | ICD-10-CM | POA: Diagnosis not present

## 2022-07-04 DIAGNOSIS — Z6830 Body mass index (BMI) 30.0-30.9, adult: Secondary | ICD-10-CM | POA: Diagnosis not present

## 2022-07-04 DIAGNOSIS — R82998 Other abnormal findings in urine: Secondary | ICD-10-CM | POA: Diagnosis not present

## 2022-07-23 ENCOUNTER — Ambulatory Visit: Payer: BC Managed Care – PPO | Admitting: Gastroenterology

## 2022-08-07 ENCOUNTER — Other Ambulatory Visit (HOSPITAL_BASED_OUTPATIENT_CLINIC_OR_DEPARTMENT_OTHER): Payer: Self-pay

## 2022-08-07 DIAGNOSIS — R5383 Other fatigue: Secondary | ICD-10-CM

## 2022-09-19 ENCOUNTER — Encounter (HOSPITAL_BASED_OUTPATIENT_CLINIC_OR_DEPARTMENT_OTHER): Payer: BC Managed Care – PPO | Admitting: Internal Medicine

## 2022-10-22 ENCOUNTER — Ambulatory Visit (HOSPITAL_BASED_OUTPATIENT_CLINIC_OR_DEPARTMENT_OTHER): Payer: BC Managed Care – PPO | Admitting: Internal Medicine

## 2022-10-23 ENCOUNTER — Other Ambulatory Visit: Payer: Self-pay

## 2022-10-24 ENCOUNTER — Encounter: Payer: Self-pay | Admitting: Hematology and Oncology

## 2022-10-24 ENCOUNTER — Encounter: Payer: Self-pay | Admitting: Gastroenterology

## 2022-10-24 ENCOUNTER — Ambulatory Visit (INDEPENDENT_AMBULATORY_CARE_PROVIDER_SITE_OTHER): Payer: BC Managed Care – PPO | Admitting: Gastroenterology

## 2022-10-24 ENCOUNTER — Other Ambulatory Visit: Payer: Self-pay

## 2022-10-24 VITALS — BP 154/108 | HR 70 | Temp 98.4°F | Ht 67.0 in | Wt 202.0 lb

## 2022-10-24 DIAGNOSIS — M7918 Myalgia, other site: Secondary | ICD-10-CM | POA: Diagnosis not present

## 2022-10-24 DIAGNOSIS — Z1211 Encounter for screening for malignant neoplasm of colon: Secondary | ICD-10-CM

## 2022-10-24 DIAGNOSIS — K5900 Constipation, unspecified: Secondary | ICD-10-CM | POA: Diagnosis not present

## 2022-10-24 MED ORDER — NA SULFATE-K SULFATE-MG SULF 17.5-3.13-1.6 GM/177ML PO SOLN: 354.0000 mL | Freq: Once | ORAL | 0 refills | Status: AC

## 2022-10-24 NOTE — Patient Instructions (Signed)
Patient was recommended to create a log of her blood pressure for about a week or 2 and let her PCP know via MyChart. Patient understood the importance. Blood pressure was read twice during visit and it stayed elevated.

## 2022-10-24 NOTE — Progress Notes (Signed)
Wyline Mood MD, MRCP(U.K) [address]  [address]  [place], [place] [address]  Main: [phone]  Fax: [phone]   Gastroenterology Consultation  Referring Provider:     Mort Sawyers, FNP Primary Care Physician:  Mort Sawyers, FNP Primary Gastroenterologist:  Dr. Wyline Mood  Reason for Consultation:     RLQ pain         HPI:   Rebecca Castillo is a 47 y.o. y/o female referred for consultation & management  by Mort Sawyers, FNP.    She says she has had right sided abdominal pain for over a year worse since October she has gained weight over the past 1 year.  Points to the lumbar area on the right side.  Says the pain comes on once every month lasts a few days relieved with an Aleve worse with movement particularly when she bends.  Feels like a spasm.  Not affected by food intake.  Sometimes relieved after a good bowel movement.  Denies prior colonoscopy.  No change in bowel habits.  No family history of colon cancer.  05/09/2022: RLQ pain: USG pelvis and RUQ- post hysterectomy , ovarian cysts . Normal gall bladder.  12/11/2021: Ct abdomen and pelvis: Normal, normal gallbladder.   Past Medical History:  Diagnosis Date   Family history of early CAD 07/01/2011   Iron deficiency anemia    received Feraheme infusion 04/09/2013   PONV (postoperative nausea and vomiting)    Umbilical hernia 05/2013    Past Surgical History:  Procedure Laterality Date   CESAREAN SECTION     x 4   MULTIPLE TOOTH EXTRACTIONS     abscesses , wisdom teeth as well   PARTIAL HYSTERECTOMY     still with tubes and ovaries    Prior to Admission medications   Medication Sig Start Date End Date Taking? Authorizing Provider  valACYclovir (VALTREX) 500 MG tablet Take 500 mg by mouth 2 (two) times daily. 09/23/22   [provider]    Family History  Problem Relation Age of Onset   Kidney disease Mother    Hypertension Mother    Hyperlipidemia Mother    Heart disease Mother    Heart  attack Mother        91   Stroke Mother    Healthy Father        murder   Lung cancer Maternal Grandmother    Throat cancer Maternal Grandmother      Social History   Tobacco Use   Smoking status: Never   Smokeless tobacco: Never  Vaping Use   Vaping status: Never Used  Substance Use Topics   Alcohol use: Yes    Comment: occasionally   Drug use: No    Allergies as of 10/24/2022 - Review Complete 10/24/2022  Allergen Reaction Noted   Oxycodone Other (See Comments) 01/28/2012   Other Rash 10/23/2022    Review of Systems:    All systems reviewed and negative except where noted in HPI.   Physical Exam:  BP (!) 155/99   Pulse 63   Temp 98.4 F (36.9 C) (Oral)   Ht 5\' 7"  (1.702 m)   Wt 202 lb (91.6 kg)   LMP 10/28/2016   BMI 31.64 kg/m  Patient's last menstrual period was 10/28/2016. Psych:  Alert and cooperative. Normal mood and affect. General:   Alert,  Well-developed, well-nourished, pleasant and cooperative in NAD Head:  Normocephalic and atraumatic. Eyes:  Sclera clear, no icterus.   Conjunctiva pink. Ears:  Normal auditory acuity. Tenderness over the right side of the abdomen just above the iliac crest over her lower ribs in the mid axillary line.  Application of pressure replicates the pain that she has. Abdomen:  Normal bowel sounds.  No bruits.  Soft, non-tender and non-distended without masses, hepatosplenomegaly or hernias noted.  No guarding or rebound tenderness.    Neurologic:  Alert and oriented x3;  grossly normal neurologically. Psych:  Alert and cooperative. Normal mood and affect.  Imaging Studies: No results found.  Assessment and Plan:   Rebecca Castillo is a 47 y.o. y/o female has been referred for sided abdominal pain.  Based on examination history the pain is musculoskeletal in nature.  She has never had a screening colonoscopy and is due for the same.  Plan 1.  For the right-sided pain which is musculoskeletal suggest physical therapy to  follow-up with Mort Sawyers, FNP.  She is tender on the right lower ribs likely compression of nerves.  2.  Screening colonoscopy to be scheduled  3.  She does have a degree of constipation suggest high-fiber diet patient information provided if it does not help to commence on MiraLAX 1 capful a day.   I have discussed alternative options, risks & benefits,  which include, but are not limited to, bleeding, infection, perforation,respiratory complication & drug reaction.  The patient agrees with this plan & written consent will be obtained.     Follow up in as needed  Dr Wyline Mood MD,MRCP(U.K)

## 2022-10-24 NOTE — Addendum Note (Signed)
Addended by: Adela Ports on: 10/24/2022 01:28 PM   Modules accepted: Orders

## 2022-10-28 ENCOUNTER — Ambulatory Visit: Payer: BC Managed Care – PPO | Admitting: Internal Medicine

## 2022-10-28 ENCOUNTER — Encounter: Payer: Self-pay | Admitting: Internal Medicine

## 2022-10-28 ENCOUNTER — Telehealth: Payer: Self-pay

## 2022-10-28 VITALS — BP 122/88 | HR 96 | Temp 98.2°F | Ht 67.0 in | Wt 200.0 lb

## 2022-10-28 DIAGNOSIS — R030 Elevated blood-pressure reading, without diagnosis of hypertension: Secondary | ICD-10-CM | POA: Insufficient documentation

## 2022-10-28 NOTE — Assessment & Plan Note (Signed)
BP Readings from Last 3 Encounters:  10/28/22 122/88  10/24/22 (!) 154/108  04/01/22 122/88   Repeat by me 130/80 on right Discussed that her BP seems okay  DASH info--cut out salt---more regular exercise She will monitor at home--probably start medication if consistently over 150/100

## 2022-10-28 NOTE — Telephone Encounter (Signed)
Per appt notes pt already has appt with dr Alphonsus Sias on 10/28/22 at 11:15. Sending note to Dr Alphonsus Sias and Alphonsus Sias pool.

## 2022-10-28 NOTE — Progress Notes (Signed)
Subjective:    Patient ID: Rebecca Castillo, female    DOB: 06-Apr-1975, 47 y.o.   MRN: [id number]  HPI Here due to elevated blood pressure  Was elevated at GI appointment last week This is not typical for her---had just gotten off work, thought it was related to that Did recheck at end of appt--still elevated  She monitored it after the appt  155/108 that day 2 days ago---down to 145/107  No chest pain or SOB Some headache---relates to work stress Moving now---packing up (moving to [employer]) Rare dizziness--after "moving too fast"  Current Outpatient Medications on File Prior to Visit  Medication Sig Dispense Refill   valACYclovir (VALTREX) 500 MG tablet Take 500 mg by mouth 2 (two) times daily. (Patient not taking: Reported on 10/24/2022)     No current facility-administered medications on file prior to visit.    Allergies  Allergen Reactions   Oxycodone Other (See Comments)    DIZZINESS   Other Rash    Past Medical History:  Diagnosis Date   Family history of early CAD 07/01/2011   Iron deficiency anemia    received Feraheme infusion 04/09/2013   PONV (postoperative nausea and vomiting)    Umbilical hernia 05/2013    Past Surgical History:  Procedure Laterality Date   CESAREAN SECTION     x 4   MULTIPLE TOOTH EXTRACTIONS     abscesses , wisdom teeth as well   PARTIAL HYSTERECTOMY     still with tubes and ovaries    Family History  Problem Relation Age of Onset   Kidney disease Mother    Hypertension Mother    Hyperlipidemia Mother    Heart disease Mother    Heart attack Mother        67   Stroke Mother    Healthy Father        murder   Lung cancer Maternal Grandmother    Throat cancer Maternal Grandmother     Social History   Socioeconomic History   Marital status: Single    Spouse name: Not on file   Number of children: Not on file   Years of education: Not on file   Highest education level: Not on file  Occupational History    Employer:  [employer]    Comment: supervisor  Tobacco Use   Smoking status: Never   Smokeless tobacco: Never  Vaping Use   Vaping status: Never Used  Substance and Sexual Activity   Alcohol use: Yes    Comment: occasionally   Drug use: No   Sexual activity: Yes    Partners: Male    Birth control/protection: Surgical  Other Topics Concern   Not on file  Social History Narrative   Two boys two girls    Four c sections   Social Determinants of Health   Financial Resource Strain: Not on file  Food Insecurity: Not on file  Transportation Needs: Not on file  Physical Activity: Not on file  Stress: Not on file  Social Connections: Not on file  Intimate Partner Violence: Not on file   Review of Systems Weight is about the same No exercise---but walks and does steps Does use some salt    Objective:   Physical Exam Constitutional:      Appearance: Normal appearance.  Cardiovascular:     Rate and Rhythm: Normal rate and regular rhythm.     Heart sounds: No murmur heard.    No gallop.  Pulmonary:  Effort: Pulmonary effort is normal.     Breath sounds: Normal breath sounds. No wheezing or rales.  Musculoskeletal:     Cervical back: Neck supple.     Right lower leg: No edema.     Left lower leg: No edema.  Lymphadenopathy:     Cervical: No cervical adenopathy.  Neurological:     Mental Status: She is alert.  Psychiatric:        Mood and Affect: Mood normal.        Behavior: Behavior normal.            Assessment & Plan:

## 2022-10-28 NOTE — Telephone Encounter (Signed)
I will assess her at the [facility]

## 2022-10-28 NOTE — Patient Instructions (Signed)

## 2022-11-08 ENCOUNTER — Encounter
Admission: RE | Disposition: A | Discharge status: Home / Self Care | Payer: Self-pay | Source: Home / Self Care | Attending: Gastroenterology

## 2022-11-08 ENCOUNTER — Encounter: Payer: Self-pay | Admitting: Anesthesiology

## 2022-11-08 ENCOUNTER — Other Ambulatory Visit: Payer: Self-pay

## 2022-11-08 ENCOUNTER — Ambulatory Visit
Admission: RE | Admit: 2022-11-08 | Discharge: 2022-11-08 | Disposition: A | Discharge status: Home / Self Care | Payer: BC Managed Care – PPO | Attending: Gastroenterology | Admitting: Gastroenterology

## 2022-11-08 DIAGNOSIS — Z1211 Encounter for screening for malignant neoplasm of colon: Secondary | ICD-10-CM | POA: Diagnosis not present

## 2022-11-08 DIAGNOSIS — Z539 Procedure and treatment not carried out, unspecified reason: Secondary | ICD-10-CM | POA: Diagnosis not present

## 2022-11-08 SURGERY — COLONOSCOPY WITH PROPOFOL
Anesthesia: General

## 2022-11-08 MED ORDER — SODIUM CHLORIDE 0.9 % IV SOLN: INTRAVENOUS | Status: DC

## 2022-11-08 NOTE — Anesthesia Preprocedure Evaluation (Signed)
Anesthesia Evaluation  Patient identified by MRN, date of birth, ID band Patient awake    Reviewed: Allergy & Precautions, NPO status , Patient's Chart, lab work & pertinent test results  History of Anesthesia Complications (+) PONV and history of anesthetic complications  Airway Mallampati: III  TM Distance: >3 FB Neck ROM: full    Dental no notable dental hx.    Pulmonary neg pulmonary ROS   Pulmonary exam normal        Cardiovascular negative cardio ROS Normal cardiovascular exam     Neuro/Psych negative neurological ROS  negative psych ROS   GI/Hepatic negative GI ROS, Neg liver ROS,,,  Endo/Other  negative endocrine ROS    Renal/GU negative Renal ROS  negative genitourinary   Musculoskeletal   Abdominal   Peds  Hematology  (+) Blood dyscrasia, anemia   Anesthesia Other Findings Past Medical History: 07/01/2011: Family history of early CAD No date: Iron deficiency anemia     Comment:  received Feraheme infusion 04/09/2013 No date: PONV (postoperative nausea and vomiting) 05/2013: Umbilical hernia  Past Surgical History: No date: CESAREAN SECTION     Comment:  x 4 No date: MULTIPLE TOOTH EXTRACTIONS     Comment:  abscesses , wisdom teeth as well No date: PARTIAL HYSTERECTOMY     Comment:  still with tubes and ovaries  BMI 31     Reproductive/Obstetrics negative OB ROS                             Anesthesia Physical Anesthesia Plan  ASA: 2  Anesthesia Plan: General   Post-op Pain Management: Minimal or no pain anticipated   Induction: Intravenous  PONV Risk Score and Plan: 2 and Propofol infusion and TIVA  Airway Management Planned: Natural Airway and Nasal Cannula  Additional Equipment:   Intra-op Plan:   Post-operative Plan:   Informed Consent: I have reviewed the patients History and Physical, chart, labs and discussed the procedure including the risks,  benefits and alternatives for the proposed anesthesia with the patient or authorized representative who has indicated his/her understanding and acceptance.     Dental Advisory Given  Plan Discussed with: Anesthesiologist, CRNA and Surgeon  Anesthesia Plan Comments: (Patient consented for risks of anesthesia including but not limited to:  - adverse reactions to medications - risk of airway placement if required - damage to eyes, teeth, lips or other oral mucosa - nerve damage due to positioning  - sore throat or hoarseness - Damage to heart, brain, nerves, lungs, other parts of body or loss of life  Patient voiced understanding.)       Anesthesia Quick Evaluation

## 2022-11-08 NOTE — Progress Notes (Signed)
Patient not clean will reschedule

## 2022-11-11 ENCOUNTER — Emergency Department: Payer: BC Managed Care – PPO

## 2022-11-11 ENCOUNTER — Emergency Department
Admission: EM | Admit: 2022-11-11 | Discharge: 2022-11-11 | Disposition: A | Discharge status: Home / Self Care | Payer: BC Managed Care – PPO | Attending: Emergency Medicine | Admitting: Emergency Medicine

## 2022-11-11 ENCOUNTER — Other Ambulatory Visit: Payer: Self-pay

## 2022-11-11 DIAGNOSIS — Z90710 Acquired absence of both cervix and uterus: Secondary | ICD-10-CM | POA: Diagnosis not present

## 2022-11-11 DIAGNOSIS — N83201 Unspecified ovarian cyst, right side: Secondary | ICD-10-CM | POA: Diagnosis not present

## 2022-11-11 DIAGNOSIS — N83291 Other ovarian cyst, right side: Secondary | ICD-10-CM | POA: Diagnosis not present

## 2022-11-11 DIAGNOSIS — N838 Other noninflammatory disorders of ovary, fallopian tube and broad ligament: Secondary | ICD-10-CM | POA: Diagnosis not present

## 2022-11-11 DIAGNOSIS — R1031 Right lower quadrant pain: Secondary | ICD-10-CM | POA: Diagnosis not present

## 2022-11-11 LAB — URINALYSIS, ROUTINE W REFLEX MICROSCOPIC
Bilirubin Urine: NEGATIVE
Glucose, UA: NEGATIVE mg/dL
Hgb urine dipstick: NEGATIVE
Ketones, ur: NEGATIVE mg/dL
Leukocytes,Ua: NEGATIVE
Nitrite: NEGATIVE
Protein, ur: NEGATIVE mg/dL
Specific Gravity, Urine: 1.032 — ABNORMAL HIGH (ref 1.005–1.030)
pH: 5 (ref 5.0–8.0)

## 2022-11-11 LAB — CBC
HCT: 39.8 % (ref 36.0–46.0)
Hemoglobin: 13.2 g/dL (ref 12.0–15.0)
MCH: 28.7 pg (ref 26.0–34.0)
MCHC: 33.2 g/dL (ref 30.0–36.0)
MCV: 86.5 fL (ref 80.0–100.0)
Platelets: 346 10*3/uL (ref 150–400)
RBC: 4.6 MIL/uL (ref 3.87–5.11)
RDW: 13 % (ref 11.5–15.5)
WBC: 9.6 10*3/uL (ref 4.0–10.5)
nRBC: 0 % (ref 0.0–0.2)

## 2022-11-11 LAB — COMPREHENSIVE METABOLIC PANEL
ALT: 23 U/L (ref 0–44)
AST: 22 U/L (ref 15–41)
Albumin: 3.9 g/dL (ref 3.5–5.0)
Alkaline Phosphatase: 68 U/L (ref 38–126)
Anion gap: 7 (ref 5–15)
BUN: 15 mg/dL (ref 6–20)
CO2: 25 mmol/L (ref 22–32)
Calcium: 8.8 mg/dL — ABNORMAL LOW (ref 8.9–10.3)
Chloride: 105 mmol/L (ref 98–111)
Creatinine, Ser: 0.79 mg/dL (ref 0.44–1.00)
GFR, Estimated: >60 mL/min (ref >60)
Glucose, Bld: 110 mg/dL — ABNORMAL HIGH (ref 70–99)
Potassium: 3.8 mmol/L (ref 3.5–5.1)
Sodium: 137 mmol/L (ref 135–145)
Total Bilirubin: 0.6 mg/dL (ref 0.3–1.2)
Total Protein: 7.3 g/dL (ref 6.5–8.1)

## 2022-11-11 LAB — LIPASE, BLOOD: Lipase: 37 U/L (ref 11–51)

## 2022-11-11 MED ORDER — IOHEXOL 300 MG/ML  SOLN
100.0000 mL | Freq: Once | INTRAMUSCULAR | Status: AC | PRN
  Administered 2022-11-11: 100 mL via INTRAVENOUS

## 2022-11-11 MED ORDER — HYDROCODONE-ACETAMINOPHEN 5-325 MG PO TABS: 1.0000 | ORAL_TABLET | Freq: Four times a day (QID) | ORAL | 0 refills | Status: DC | PRN

## 2022-11-11 MED ORDER — SODIUM CHLORIDE 0.9 % IV BOLUS
1000.0000 mL | Freq: Once | INTRAVENOUS | Status: AC
  Administered 2022-11-11: 1000 mL via INTRAVENOUS

## 2022-11-11 MED ORDER — ONDANSETRON HCL 4 MG/2ML IJ SOLN
4.0000 mg | Freq: Once | INTRAMUSCULAR | Status: AC
  Administered 2022-11-11: 4 mg via INTRAVENOUS
  Filled 2022-11-11: qty 2

## 2022-11-11 MED ORDER — HYDROCODONE-ACETAMINOPHEN 5-325 MG PO TABS: 1.0000 | ORAL_TABLET | Freq: Four times a day (QID) | ORAL | 0 refills | Status: AC | PRN

## 2022-11-11 MED ORDER — MORPHINE SULFATE (PF) 4 MG/ML IV SOLN
4.0000 mg | Freq: Once | INTRAVENOUS | Status: AC
  Administered 2022-11-11: 4 mg via INTRAVENOUS
  Filled 2022-11-11: qty 1

## 2022-11-11 NOTE — ED Provider Notes (Signed)
[facility] Provider Note    Event Date/Time   First MD Initiated Contact with Patient 11/11/22 [phone]     (approximate)   History   Abdominal Pain   HPI  Rebecca Castillo is a 47 year old female with prior hysterectomy presenting to the Emergency Department for evaluation of abdominal pain.  On Thursday, she began to develop right lower quadrant abdominal pain.  Reports the pain is intermittent, sharp, contraction-like.  No associated nausea, vomiting.  Denies dysuria.  Having regular bowel movements.  Reports she had been having a pain more in her flank that was being worked up as her primary care doctor without obvious cause, but this pain is different.     Physical Exam   Triage Vital Signs: ED Triage Vitals  Encounter Vitals Group     BP 11/11/22 0448 (!) 150/96     Systolic BP Percentile --      Diastolic BP Percentile --      Pulse Rate 11/11/22 0448 75     Resp 11/11/22 0448 18     Temp 11/11/22 0448 98.1 F (36.7 C)     Temp src --      SpO2 11/11/22 0448 100 %     Weight 11/11/22 0446 196 lb (88.9 kg)     Height 11/11/22 0446 5\' 7"  (1.702 m)     Head Circumference --      Peak Flow --      Pain Score --      Pain Loc --      Pain Education --      Exclude from Growth Chart --     Most recent vital signs: Vitals:   11/11/22 0448 11/11/22 0850  BP: (!) 150/96 (!) 148/88  Pulse: 75 70  Resp: 18 18  Temp: 98.1 F (36.7 C) 98 F (36.7 C)  SpO2: 100% 100%     General: Awake, interactive  CV:  Regular rate, good peripheral perfusion.  Resp:  Lungs clear, unlabored respirations.  Abd:  Soft, nondistended, focal tenderness to palpation of the right lower quadrant and suprapubic region Neuro:  Symmetric facial movement, fluid speech   ED Results / Procedures / Treatments   Labs (all labs ordered are listed, but only abnormal results are displayed) Labs Reviewed  COMPREHENSIVE METABOLIC PANEL - Abnormal; Notable for the  following components:      Result Value   Glucose, Bld 110 (*)    Calcium 8.8 (*)    All other components within normal limits  URINALYSIS, ROUTINE W REFLEX MICROSCOPIC - Abnormal; Notable for the following components:   Color, Urine YELLOW (*)    APPearance HAZY (*)    Specific Gravity, Urine 1.032 (*)    All other components within normal limits  LIPASE, BLOOD  CBC     EKG EKG independently reviewed interpreted by myself (ER attending) demonstrates:    RADIOLOGY Imaging independently reviewed and interpreted by myself demonstrates:  CT abdomen pelvis with normal appendix, enlarged right ovary with cystic structure noted Pelvic ultrasound demonstrates a 2.3 cm right ovarian cyst without evidence of torsion   PROCEDURES:  Critical Care performed: No  Procedures   MEDICATIONS ORDERED IN ED: Medications  ondansetron (ZOFRAN) injection 4 mg (4 mg Intravenous Given 11/11/22 0753)  sodium chloride 0.9 % bolus 1,000 mL (0 mLs Intravenous Stopped 11/11/22 1006)  morphine (PF) 4 MG/ML injection 4 mg (4 mg Intravenous Given 11/11/22 0753)  iohexol (OMNIPAQUE) 300 MG/ML solution 100 mL (100  mLs Intravenous Contrast Given 11/11/22 0853)     IMPRESSION / MDM / ASSESSMENT AND PLAN / ED COURSE  I reviewed the triage vital signs and the nursing notes.  Differential diagnosis includes, but is not limited to, appendicitis, diverticulitis, colitis, UTI, pyelonephritis, ovarian pathology  Patient's presentation is most consistent with acute presentation with potential threat to life or bodily function.  47 year old female presenting to the emergency department for RLQ abdominal pain.  Labs from triage without significant derangement.  UA without evidence of infection.  Given localized pain, will obtain CT to further evaluate and treat symptomatically with IV fluids, morphine, Zofran.  Clinical Course as of 11/11/22 1246  Mon Nov 11, 2022  1610 CT demonstrates asymmetric enlargement of  the right ovary with adjacent fat stranding.  Pelvic ultrasound ordered to further evaluate. [NR]    Clinical Course User Index [NR] Trinna Post, MD   Pelvic ultrasound notable for complex cyst of the ovary with further imaging with MRI recommended by radiology.  Discussed with patient.  She does follow with [facility] with Dr. Henderson Cloud. I did speak with Jasmine December at the [facility].  She reported that patient could call the office and they could arrange close follow-up for the patient.  With this, do think it is reasonable to defer MRI to an outpatient.  On reevaluation, patient denies any ongoing abdominal pain.  She is comfortable with discharge and outpatient follow-up.  Strict return precautions provided.  Patient discharged in stable condition.   FINAL CLINICAL IMPRESSION(S) / ED DIAGNOSES   Final diagnoses:  RLQ abdominal pain  Cyst of right ovary     Rx / DC Orders   ED Discharge Orders          Ordered    HYDROcodone-acetaminophen (NORCO) 5-325 MG tablet  Every 6 hours PRN,   Status:  Discontinued        11/11/22 1244    HYDROcodone-acetaminophen (NORCO) 5-325 MG tablet  Every 6 hours PRN        11/11/22 1245             Note:  This document was prepared using Dragon voice recognition software and may include unintentional dictation errors.   Trinna Post, MD 11/11/22 [phone]

## 2022-11-11 NOTE — ED Notes (Signed)
See triage note  Presents with pain to right lower abd/groin area  States pain started on Thursday Denies any n/v/d or fever  No urinary sxs'

## 2022-11-11 NOTE — Discharge Instructions (Addendum)
You were seen in the emergency department today for evaluation of your abdominal pain.  Your blood work was overall reassuring.  Your imaging did show that you have a complex cyst that will need to be followed up closely as an outpatient.  Please call your OB/GYN office to arrange this.  I have included the radiology reads below that you can share with your physician.  Return to the ER for new or worsening symptoms.  CLINICAL DATA:  Right lower quadrant abdominal pain    EXAM:  CT ABDOMEN AND PELVIS WITH CONTRAST    TECHNIQUE:  Multidetector CT imaging of the abdomen and pelvis was performed  using the standard protocol following bolus administration of  intravenous contrast.    RADIATION DOSE REDUCTION: This exam was performed according to the  departmental dose-optimization program which includes automated  exposure control, adjustment of the mA and/or kV according to  patient size and/or use of iterative reconstruction technique.    CONTRAST:  OMNIPAQUE IOHEXOL 300 MG/ML  SOLN    COMPARISON:  12/11/2021    FINDINGS:  Lower chest: No acute abnormality.    Hepatobiliary: No focal liver abnormality is seen. No gallstones,  gallbladder wall thickening, or biliary dilatation.    Pancreas: Unremarkable. No pancreatic ductal dilatation or  surrounding inflammatory changes.    Spleen: Normal in size without focal abnormality.    Adrenals/Urinary Tract: Adrenal glands, kidneys, and ureters are  normal. Diffuse bladder wall thickening likely due to under  distension. Chronic cystitis and outlet obstruction can have a  similar appearance.    Stomach/Bowel: Stomach is within normal limits. Appendix appears  normal. No evidence of bowel wall thickening, distention, or  inflammatory changes.    Vascular/Lymphatic: No significant vascular findings are present. No  enlarged abdominal or pelvic lymph nodes.    Reproductive: The uterus is unremarkable. The right ovary is   asymmetrically enlarged measuring 5.7 x 3.1 cm. There is a complex  mixed density structure within the lateral aspect of the right ovary  which measures 3.8 x 3.4 cm. There is nonspecific mild fat stranding  adjacent to the right ovary.    Other: No abdominal wall hernia or abnormality. No abdominopelvic  ascites.    Musculoskeletal: No acute or significant osseous findings.    IMPRESSION:  1. Asymmetric enlargement of the right ovary with a complex mixed  density structure measuring 3.8 x 3.4 cm. There is nonspecific mild  fat stranding adjacent to the right ovary. Further evaluation with  pelvic ultrasound is recommended.  2. Normal appendix.  3. Diffuse bladder wall thickening likely due to under distension.  Chronic cystitis and outlet obstruction can have a similar  appearance.      Electronically Signed    By: Acquanetta Belling M.D.    On: 11/11/2022 09:46   CLINICAL DATA:  Right lower quadrant abdominal pain.    EXAM:  TRANSABDOMINAL AND TRANSVAGINAL ULTRASOUND OF PELVIS    DOPPLER ULTRASOUND OF OVARIES    TECHNIQUE:  Both transabdominal and transvaginal ultrasound examinations of the  pelvis were performed. Transabdominal technique was performed for  global imaging of the pelvis including uterus, ovaries, adnexal  regions, and pelvic cul-de-sac.    It was necessary to proceed with endovaginal exam following the  transabdominal exam to visualize the ovaries and adnexal regions.  Color and duplex Doppler ultrasound was utilized to evaluate blood  flow to the ovaries.    COMPARISON:  May 08, 2022.    FINDINGS:  Status post hysterectomy.    Right ovary    Measurements: 4.6 x 4.1 x 3.1 cm = volume: 31 mL. 2.3 x 2.0 x 1.6 cm  complex cyst is noted in right ovary with solid components. Several  other cysts are noted in right ovary, 1 with thin septa.    Left ovary    Not visualized.    Pulsed Doppler evaluation of right ovary demonstrates normal   low-resistance arterial and venous waveforms.    Other findings    No abnormal free fluid.    IMPRESSION:  Status post hysterectomy.    Left ovary is not visualized.    No evidence of right ovarian torsion.    2.3 cm complex right ovarian cyst is noted with solid components.  Cystic neoplasm cannot be excluded. Further evaluation with MRI is  recommended.      Electronically Signed    By: Lupita Raider M.D.    On: 11/11/2022 12:14

## 2022-11-11 NOTE — ED Notes (Signed)
Pt to ED via POV c/o RLQ abd pain that started Thursday. Pt says pain woke her out of sleep this morning. Denies N/V/D. Pt still has appendix. Denies CP, SOB, fevers. Last took ibuprofen around 0330

## 2022-11-12 ENCOUNTER — Other Ambulatory Visit: Payer: Self-pay | Admitting: Family

## 2022-11-12 DIAGNOSIS — N83291 Other ovarian cyst, right side: Secondary | ICD-10-CM

## 2022-11-13 ENCOUNTER — Ambulatory Visit (HOSPITAL_BASED_OUTPATIENT_CLINIC_OR_DEPARTMENT_OTHER): Payer: BC Managed Care – PPO | Attending: Obstetrics and Gynecology | Admitting: Internal Medicine

## 2022-11-29 ENCOUNTER — Ambulatory Visit
Admission: RE | Admit: 2022-11-29 | Discharge: 2022-11-29 | Disposition: A | Discharge status: Home / Self Care | Payer: BC Managed Care – PPO | Source: Ambulatory Visit | Attending: Family | Admitting: Family

## 2022-11-29 DIAGNOSIS — N83291 Other ovarian cyst, right side: Secondary | ICD-10-CM | POA: Diagnosis not present

## 2022-11-29 DIAGNOSIS — Z90711 Acquired absence of uterus with remaining cervical stump: Secondary | ICD-10-CM | POA: Diagnosis not present

## 2022-11-29 DIAGNOSIS — R1031 Right lower quadrant pain: Secondary | ICD-10-CM | POA: Diagnosis not present

## 2022-11-29 MED ORDER — GADOPICLENOL 0.5 MMOL/ML IV SOLN
10.0000 mL | Freq: Once | INTRAVENOUS | Status: AC | PRN
  Administered 2022-11-29: 9 mL via INTRAVENOUS

## 2022-12-04 DIAGNOSIS — N83201 Unspecified ovarian cyst, right side: Secondary | ICD-10-CM | POA: Diagnosis not present

## 2022-12-05 ENCOUNTER — Telehealth: Payer: Self-pay

## 2022-12-05 NOTE — Telephone Encounter (Signed)
Spoke with Ms.Jilek regarding her referral to GYN oncology. She has an appointment scheduled with Dr. Alvester Morin on 12/23/22 at 11:15. Patient agrees to date and time. She has been provided with office address and location. She is also aware of our mask and visitor policy. Patient verbalized understanding and will call with any questions.

## 2022-12-13 ENCOUNTER — Encounter: Payer: Self-pay | Admitting: Psychiatry

## 2022-12-16 ENCOUNTER — Inpatient Hospital Stay: Payer: BC Managed Care – PPO

## 2022-12-16 ENCOUNTER — Inpatient Hospital Stay: Payer: BC Managed Care – PPO | Attending: Psychiatry | Admitting: Psychiatry

## 2022-12-16 ENCOUNTER — Inpatient Hospital Stay: Payer: BC Managed Care – PPO | Admitting: Gynecologic Oncology

## 2022-12-16 ENCOUNTER — Encounter: Payer: Self-pay | Admitting: Psychiatry

## 2022-12-16 VITALS — BP 170/84 | HR 64 | Temp 98.4°F | Resp 18 | Ht 67.0 in | Wt 203.0 lb

## 2022-12-16 DIAGNOSIS — N809 Endometriosis, unspecified: Secondary | ICD-10-CM | POA: Insufficient documentation

## 2022-12-16 DIAGNOSIS — Z801 Family history of malignant neoplasm of trachea, bronchus and lung: Secondary | ICD-10-CM | POA: Insufficient documentation

## 2022-12-16 DIAGNOSIS — Z791 Long term (current) use of non-steroidal anti-inflammatories (NSAID): Secondary | ICD-10-CM | POA: Diagnosis not present

## 2022-12-16 DIAGNOSIS — R971 Elevated cancer antigen 125 [CA 125]: Secondary | ICD-10-CM | POA: Diagnosis not present

## 2022-12-16 DIAGNOSIS — Z800 Family history of malignant neoplasm of digestive organs: Secondary | ICD-10-CM | POA: Diagnosis not present

## 2022-12-16 DIAGNOSIS — N83201 Unspecified ovarian cyst, right side: Secondary | ICD-10-CM | POA: Diagnosis not present

## 2022-12-16 DIAGNOSIS — Z90710 Acquired absence of both cervix and uterus: Secondary | ICD-10-CM | POA: Diagnosis not present

## 2022-12-16 DIAGNOSIS — Z79624 Long term (current) use of inhibitors of nucleotide synthesis: Secondary | ICD-10-CM | POA: Insufficient documentation

## 2022-12-16 LAB — CEA (ACCESS): CEA (CHCC): <1 ng/mL (ref 0.00–5.00)

## 2022-12-16 MED ORDER — TRAMADOL HCL 50 MG PO TABS
50.0000 mg | ORAL_TABLET | Freq: Four times a day (QID) | ORAL | 0 refills | Status: DC | PRN
Start: 2022-12-16 — End: 2023-01-28

## 2022-12-16 MED ORDER — SENNOSIDES-DOCUSATE SODIUM 8.6-50 MG PO TABS
2.0000 | ORAL_TABLET | Freq: Every day | ORAL | 0 refills | Status: DC
Start: 2022-12-16 — End: 2023-01-28

## 2022-12-16 MED ORDER — IBUPROFEN 800 MG PO TABS
800.0000 mg | ORAL_TABLET | Freq: Three times a day (TID) | ORAL | 0 refills | Status: DC | PRN
Start: 2022-12-16 — End: 2023-01-28

## 2022-12-16 NOTE — Progress Notes (Signed)
Patient here for a pre-operative appointment prior to her scheduled surgery on 01/14/2023. She is scheduled for a robotic assisted laparoscopic unilateral salpingo-oophorectomy, possible bilateral, possible staging if a precancer or cancer is identified, possible laparotomy. The surgery was discussed in detail.  See after visit summary for additional details.    Discussed post-op pain management in detail including the aspects of the enhanced recovery pathway.  Advised her that a new prescription would be sent in for Tramadol and ibuprofen and it is only to be used for after her upcoming surgery.  We discussed the use of tylenol post-op and to monitor for a maximum of 4,000 mg in a 24 hour period.  Also prescribed sennakot to be used after surgery and to hold if having loose stools.  Discussed bowel regimen in detail.     Discussed the use of SCDs and measures to take at home to prevent DVT including frequent mobility.  Reportable signs and symptoms of DVT discussed. Post-operative instructions discussed and expectations for after surgery. Incisional care discussed as well including reportable signs and symptoms including erythema, drainage, wound separation.     30 minutes spent with the patient.  Verbalizing understanding of material discussed. No needs or concerns voiced at the end of the visit.   Advised patient to call for any needs.  Advised that her post-operative medications had been prescribed and could be picked up at any time.    This appointment is included in the global surgical bundle as pre-operative teaching and has no charge.

## 2022-12-16 NOTE — Patient Instructions (Signed)
Preparing for your Surgery  Plan for surgery on January 14, 2023 with Dr. Clide Cliff at [facility]. You will be scheduled for robotic assisted laparoscopic unilateral salpingo-oophorectomy (removal of one ovary and fallopian tube), possible bilateral (removal of both), possible staging if a precancer or cancer is identified, possible laparotomy.   Pre-operative Testing -You will receive a phone call from presurgical testing at [facility] to arrange for a pre-operative appointment and lab work.  -Bring your insurance card, copy of an advanced directive if applicable, medication list  -At that visit, you will be asked to sign a consent for a possible blood transfusion in case a transfusion becomes necessary during surgery.  The need for a blood transfusion is rare but having consent is a necessary part of your care.     -You should not be taking blood thinners or aspirin at least ten days prior to surgery unless instructed by your surgeon.  -Do not take supplements such as fish oil (omega 3), red yeast rice, turmeric before your surgery. You want to avoid medications with aspirin in them including headache powders such as BC or Goody's), Excedrin migraine.  Day Before Surgery at Home -You will be asked to take in a light diet the day before surgery. You will be advised you can have clear liquids up until 3 hours before your surgery.    Eat a light diet the day before surgery.  Examples including soups, broths, toast, yogurt, mashed potatoes.  AVOID GAS PRODUCING FOODS AND BEVERAGES. Things to avoid include carbonated beverages (fizzy beverages, sodas), raw fruits and raw vegetables (uncooked), or beans.   If your bowels are filled with gas, your surgeon will have difficulty visualizing your pelvic organs which increases your surgical risks.  Your role in recovery Your role is to become active as soon as directed by your doctor, while still giving yourself time to heal.   Rest when you feel tired. You will be asked to do the following in order to speed your recovery:  - Cough and breathe deeply. This helps to clear and expand your lungs and can prevent pneumonia after surgery.  - STAY ACTIVE WHEN YOU GET HOME. Do mild physical activity. Walking or moving your legs help your circulation and body functions return to normal. Do not try to get up or walk alone the first time after surgery.   -If you develop swelling on one leg or the other, pain in the back of your leg, redness/warmth in one of your legs, please call the office or go to the Emergency Room to have a doppler to rule out a blood clot. For shortness of breath, chest pain-seek care in the Emergency Room as soon as possible. - Actively manage your pain. Managing your pain lets you move in comfort. We will ask you to rate your pain on a scale of zero to 10. It is your responsibility to tell your doctor or nurse where and how much you hurt so your pain can be treated.  Special Considerations -If you are diabetic, you may be placed on insulin after surgery to have closer control over your blood sugars to promote healing and recovery.  This does not mean that you will be discharged on insulin.  If applicable, your oral antidiabetics will be resumed when you are tolerating a solid diet.  -Your final pathology results from surgery should be available around one week after surgery and the results will be relayed to you when available.  -  Dr. Antionette Char is the surgeon that assists your GYN Oncologist with surgery.  If you end up staying the night, the next day after your surgery you will either see Dr. Pricilla Holm, Dr. Alvester Morin, or Dr. Antionette Char.  -FMLA forms can be faxed to [phone] and please allow 5-7 business days for completion.  Pain Management After Surgery -You will be prescribed your pain medication and bowel regimen medications before surgery so that you can have these available when you are  discharged from the hospital. The pain medication is for use ONLY AFTER surgery and a new prescription will not be given.   -Make sure that you have Tylenol and Ibuprofen IF YOU ARE ABLE TO TAKE THESE MEDICATIONS at home to use on a regular basis after surgery for pain control. We recommend alternating the medications every hour to six hours since they work differently and are processed in the body differently for pain relief.  -Review the attached handout on narcotic use and their risks and side effects.   Bowel Regimen -You will be prescribed Sennakot-S to take nightly to prevent constipation especially if you are taking the narcotic pain medication intermittently.  It is important to prevent constipation and drink adequate amounts of liquids. You can stop taking this medication when you are not taking pain medication and you are back on your normal bowel routine.  Risks of Surgery Risks of surgery are low but include bleeding, infection, damage to surrounding structures, re-operation, blood clots, and very rarely death.   Blood Transfusion Information (For the consent to be signed before surgery)  We will be checking your blood type before surgery so in case of emergencies, we will know what type of blood you would need.                                            WHAT IS A BLOOD TRANSFUSION?  A transfusion is the replacement of blood or some of its parts. Blood is made up of multiple cells which provide different functions. Red blood cells carry oxygen and are used for blood loss replacement. White blood cells fight against infection. Platelets control bleeding. Plasma helps clot blood. Other blood products are available for specialized needs, such as hemophilia or other clotting disorders. BEFORE THE TRANSFUSION  Who gives blood for transfusions?  You may be able to donate blood to be used at a later date on yourself (autologous donation). Relatives can be asked to donate blood. This  is generally not any safer than if you have received blood from a stranger. The same precautions are taken to ensure safety when a relative's blood is donated. Healthy volunteers who are fully evaluated to make sure their blood is safe. This is blood bank blood. Transfusion therapy is the safest it has ever been in the practice of medicine. Before blood is taken from a donor, a complete history is taken to make sure that person has no history of diseases nor engages in risky social behavior (examples are intravenous drug use or sexual activity with multiple partners). The donor's travel history is screened to minimize risk of transmitting infections, such as malaria. The donated blood is tested for signs of infectious diseases, such as HIV and hepatitis. The blood is then tested to be sure it is compatible with you in order to minimize the chance of a transfusion reaction. If you or  a relative donates blood, this is often done in anticipation of surgery and is not appropriate for emergency situations. It takes many days to process the donated blood. RISKS AND COMPLICATIONS Although transfusion therapy is very safe and saves many lives, the main dangers of transfusion include:  Getting an infectious disease. Developing a transfusion reaction. This is an allergic reaction to something in the blood you were given. Every precaution is taken to prevent this. The decision to have a blood transfusion has been considered carefully by your caregiver before blood is given. Blood is not given unless the benefits outweigh the risks.  AFTER SURGERY INSTRUCTIONS  Return to work: 4-6 weeks if applicable  Activity: 1. Be up and out of the bed during the day.  Take a nap if needed.  You may walk up steps but be careful and use the hand rail.  Stair climbing will tire you more than you think, you may need to stop part way and rest.   2. No lifting or straining for 6 weeks over 10 pounds. No pushing, pulling, straining  for 6 weeks.  3. No driving for around 1 week(s).  Do not drive if you are taking narcotic pain medicine and make sure that your reaction time has returned.   4. You can shower as soon as the next day after surgery. Shower daily.  Use your regular soap and water (not directly on the incision) and pat your incision(s) dry afterwards; don't rub.  No tub baths or submerging your body in water until cleared by your surgeon. If you have the soap that was given to you by pre-surgical testing that was used before surgery, you do not need to use it afterwards because this can irritate your incisions.   5. No sexual activity and nothing in the vagina for 6 weeks.  6. You may experience a small amount of clear drainage from your incisions, which is normal.  If the drainage persists, increases, or changes color please call the office.  7. Do not use creams, lotions, or ointments such as neosporin on your incisions after surgery until advised by your surgeon because they can cause removal of the dermabond glue on your incisions.    8. Take Tylenol or ibuprofen first for pain if you are able to take these medications and only use narcotic pain medication for severe pain not relieved by the Tylenol or Ibuprofen.  Monitor your Tylenol intake to a max of 4,000 mg in a 24 hour period. You can alternate these medications after surgery.  Diet: 1. Low sodium Heart Healthy Diet is recommended but you are cleared to resume your normal (before surgery) diet after your procedure.  2. It is safe to use a laxative, such as Miralax or Colace, if you have difficulty moving your bowels. You have been prescribed Sennakot-S to take at bedtime every evening after surgery to keep bowel movements regular and to prevent constipation.    Wound Care: 1. Keep clean and dry.  Shower daily.  Reasons to call the Doctor: Fever - Oral temperature greater than 100.4 degrees Fahrenheit Foul-smelling vaginal discharge Difficulty  urinating Nausea and vomiting Increased pain at the site of the incision that is unrelieved with pain medicine. Difficulty breathing with or without chest pain New calf pain especially if only on one side Sudden, continuing increased vaginal bleeding with or without clots.   Contacts: For questions or concerns you should contact:  Dr. Clide Cliff at [phone]  Warner Mccreedy, NP at  [phone]  After Hours: call [phone] and have the GYN Oncologist paged/contacted (after 5 pm or on the weekends). You will speak with an after hours RN and let he or she know you have had surgery.  Messages sent via mychart are for non-urgent matters and are not responded to after hours so for urgent needs, please call the after hours number.

## 2022-12-16 NOTE — Progress Notes (Signed)
GYNECOLOGIC ONCOLOGY NEW PATIENT CONSULTATION  Date of Service: 12/16/2022 Referring Provider: Eugenie Birks, FNP   ASSESSMENT AND PLAN: Rebecca Castillo is a 48 y.o. woman with a 3cm solid right adnexal mass (O-RADS 4) and elevated CA125 (96.6).  We reviewed that the exact etiology of the pelvic mass is unclear, but could include a benign, borderline, or malignant process.  The recommended treatment is surgical excision to make a definitive diagnosis.  Based on her reported history of intraoperative findings of endometriosis that were verbally shared with her as well as the mildly elevated CA125, it is possible that this complex mass represents an endometrioma.  However, recommend some additional tumor markers to further evaluate with CEA, CA 19-9, inhibin B.  Prior CT scan does not show any other evidence of concern.  Because the mass is relatively small, we feel that a minimally invasive approach is feasible, using robotic assistance.    In the event of malignancy or borderline tumor on frozen section, we will perform indicated staging procedures. We discussed that these procedures may include omentectomy pelvic and/or para-aortic lymphadenectomy, peritoneal biopsies. We would also remove any tissue concerning for metastatic disease which could require additional procedures including bowel surgery.  Patient was consented for: Robotic assisted unilateral salpingo-oophorectomy, possible bilateral salpingo-oophorectomy, possible staging, possible laparotomy on 01/14/23.  Given that she has not started to have perimenopausal symptoms, if the mass is benign, we reviewed retaining her contralateral ovary.  However will obtain [facility] today to help guide in this decision making given her age close to average age of menopause.  But, if evidence of borderline or malignant process on frozen pathology, would recommend removal of contralateral ovary.  The risks of surgery were discussed in detail and she  understands these to including but not limited to bleeding requiring a blood transfusion, infection, injury to adjacent organs (including but not limited to the bowels, bladder, ureters, nerves, blood vessels), thromboembolic events, wound separation, hernia, possible risk of lymphedema and lymphocyst if lymphadenectomy performed, unforseen complication, and possible need for re-exploration.  If the patient experiences any of these events, she understands that her hospitalization or recovery may be prolonged and that she may need to take additional medications for a prolonged period. The patient will receive DVT and antibiotic prophylaxis as indicated. She voiced a clear understanding. She had the opportunity to ask questions and informed consent was obtained today. She wishes to proceed.  She will proceed to the lab today for CEA, CA19-9, Inhibin B, FSH. She does not require preoperative clearance. Her METs are >4.  All preoperative instructions were reviewed. Postoperative expectations were also reviewed. Written handouts were provided to the patient.   A copy of this note was sent to the patient's referring provider.  Clide Cliff, MD Gynecologic Oncology   Medical Decision Making I personally spent  TOTAL 60 minutes face-to-face and non-face-to-face in the care of this patient, which includes all pre, intra, and post visit time on the date of service.   ------------  CC: Pelvic mass  HISTORY OF PRESENT ILLNESS:  Rebecca Castillo is a 47 y.o. woman who is seen in consultation at the request of Eugenie Birks, FNP for evaluation of pelvic mass.  Patient presented to the emergency department on 11/11/2022 for evaluation of abdominal pain.  She underwent CT abdomen/pelvis which showed asymmetric enlargement of the right ovary with complex mixed density measuring 3.8 x 3.4 cm.  She then underwent a pelvic ultrasound which showed a 2.3 cm complex right ovarian cyst  with solid components.  She  then presented to her OB/GYN for follow-up.  Pain is intermittent throbbing that radiates to the back for the past month.  She has a history of prior abdominal hysterectomy for endometriosis.  A CA125 and MRI were ordered.  CA125 returned at 96.6.  MRI pelvis showed a solid mass in the right ovary with homogenous contrast-enhancement measuring 3 x 2.2 cm.  Per the MRI report, and retrospective review this was noted to be present dating back to 09/28/2019 with very slow enlargement over time.  This was noted as a O-RADS 4.  No lymphadenopathy or other evidence of metastatic disease.  Today, patient reports that she had pain in 1 month prior to her emergency department visit.  She reports it was aching.  She treated this with Aleve and it went away.  The pain then recurred 3 days prior to her ED visit and lasted 3 days.  The pain has again gone away since her ED visit and has not come back.  She denies otherwise abdominal bloating, early satiety, significant weight loss, change in bowel or bladder habits.  She denies having any hot flashes or night sweats at this time.  Patient reports she underwent an abdominal hysterectomy with Dr. Huntley Dec in 2019 for heavy vaginal bleeding.  She notes that she was told she had endometriosis that was found at the time of her surgery.  Pathology results reviewed.  No mention of endometriosis on pathology.  Operative report was obtained following patient's visit (TAH, BSO, extensive adhesiolysis).  Operative report notes that below the level of the umbilicus there were multiple adhesions of the omentum to the anterior abdominal wall.  Additionally, a portion of the uterine fundus was seen to be densely adherent to the anterior uterine wall and the bilateral adnexa were densely encased in omentum as well.  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    PAST MEDICAL HISTORY: Past Medical History:  Diagnosis Date    Family history of early CAD 07/01/2011   Iron deficiency anemia    received Feraheme infusion 04/09/2013   PONV (postoperative nausea and vomiting)    Umbilical hernia 05/2013    PAST SURGICAL HISTORY: Past Surgical History:  Procedure Laterality Date   ABDOMINAL HYSTERECTOMY  2019   still with tubes and ovaries   CESAREAN SECTION     x 4   MULTIPLE TOOTH EXTRACTIONS     abscesses , wisdom teeth as well   UMBILICAL HERNIA REPAIR  2019    OB/GYN HISTORY: OB History  Gravida Para Term Preterm AB Living  4 4 4     4   SAB IAB Ectopic Multiple Live Births          4    # Outcome Date GA Lbr Len/2nd Weight Sex Type Anes PTL Lv  4 Term      CS-Unspec   LIV  3 Term      CS-Unspec   LIV  2 Term      CS-Unspec   LIV  1 Term      CS-Unspec   LIV      Age at menarche: 19 Age at menopause: 25 Hx of HRT: no Hx of STI: HSV Last pap: 03/21/21 NILM History of abnormal pap smears: none  SCREENING STUDIES:  Last mammogram: 02/2022 Last colonoscopy: Needs to reschedule her first one  MEDICATIONS:  Current Outpatient Medications:    naproxen (NAPROSYN) 500 MG tablet,  Take 1 tablet by mouth 2 (two) times daily., Disp: , Rfl:    valACYclovir (VALTREX) 500 MG tablet, Take 500 mg by mouth 2 (two) times daily. (Patient not taking: Reported on 10/24/2022), Disp: , Rfl:   ALLERGIES: Allergies  Allergen Reactions   Oxycodone Other (See Comments)    DIZZINESS    FAMILY HISTORY: Family History  Problem Relation Age of Onset   Kidney disease Mother    Hypertension Mother    Hyperlipidemia Mother    Heart disease Mother    Heart attack Mother        70   Stroke Mother    Healthy Father        murder   Lung cancer Maternal Grandmother    Throat cancer Maternal Grandmother    Breast cancer Neg Hx    Prostate cancer Neg Hx    Pancreatic cancer Neg Hx    Ovarian cancer Neg Hx    Endometrial cancer Neg Hx    Colon cancer Neg Hx     SOCIAL HISTORY: Social History   Socioeconomic  History   Marital status: Single    Spouse name: Not on file   Number of children: Not on file   Years of education: Not on file   Highest education level: Not on file  Occupational History    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  Tobacco Use   Smoking status: Never   Smokeless tobacco: Never  Vaping Use   Vaping status: Never Used  Substance and Sexual Activity   Alcohol use: Yes    Comment: occasionally   Drug use: No   Sexual activity: Yes    Partners: Male    Birth control/protection: Surgical  Other Topics Concern   Not on file  Social History Narrative   Two boys two girls    Four c sections   Social Determinants of Health   Financial Resource Strain: Not on file  Food Insecurity: No Food Insecurity (12/13/2022)   Hunger Vital Sign    Worried About Running Out of Food in the Last Year: Never true    Ran Out of Food in the Last Year: Never true  Transportation Needs: No Transportation Needs (12/13/2022)   PRAPARE - Administrator, Civil Service (Medical): No    Lack of Transportation (Non-Medical): No  Physical Activity: Not on file  Stress: Not on file  Social Connections: Not on file  Intimate Partner Violence: Not At Risk (12/13/2022)   Humiliation, Afraid, Rape, and Kick questionnaire    Fear of Current or Ex-Partner: No    Emotionally Abused: No    Physically Abused: No    Sexually Abused: No    REVIEW OF SYSTEMS: New patient intake form was reviewed.  Complete 10-system review is negative except for the following: none  PHYSICAL EXAM: BP (!) 172/101 (BP Location: Left Arm) Comment: MD and RN notified  Pulse 64   Temp 98.4 F (36.9 C)   Resp 18   Ht 5\' 7"  (1.702 m)   Wt 203 lb (92.1 kg)   LMP 10/28/2016   BMI 31.79 kg/m  Constitutional: No acute distress. Neuro/Psych: Alert, oriented.  Head and Neck: Normocephalic, atraumatic. Neck symmetric without masses. Sclera anicteric.  Respiratory: Normal work of  breathing. Clear to auscultation bilaterally. Cardiovascular: Regular rate and rhythm, no murmurs, rubs, or gallops. Abdomen: Normoactive bowel sounds. Soft, non-distended, non-tender to palpation. No masses appreciated.Well healed pfannenstiel and umbilical incisions. Extremities: Grossly  normal range of motion. Warm, well perfused. No edema bilaterally. Skin: No rashes or lesions. Lymphatic: No cervical, supraclavicular, or inguinal adenopathy. Genitourinary: External genitalia without lesions. Urethral meatus without lesions or prolapse. On speculum exam, vagina without lesions. Bimanual exam reveals normal vaginal mucosa. Small right adnexal mass. No palpable left adnexal mass. No TTP. Exam chaperoned by Warner Mccreedy, NP   LABORATORY AND RADIOLOGIC DATA: Outside medical records were reviewed to synthesize the above history, along with the history and physical obtained during the visit.  Outside laboratory, pathology, and imaging reports were reviewed, with pertinent results below.  I personally reviewed the outside images.  WBC  Date Value Ref Range Status  11/11/2022 9.6 4.0 - 10.5 K/uL Final   Hemoglobin  Date Value Ref Range Status  11/11/2022 13.2 12.0 - 15.0 g/dL Final  09/81/1914 78.2 11.6 - 15.9 g/dL Final   HGB  Date Value Ref Range Status  10/01/2013 11.5 (L) 11.6 - 15.9 g/dL Final   HCT  Date Value Ref Range Status  11/11/2022 39.8 36.0 - 46.0 % Final  10/01/2013 35.4 34.8 - 46.6 % Final   Platelets  Date Value Ref Range Status  11/11/2022 346 150 - 400 K/uL Final  10/01/2013 274 145 - 400 10e3/uL Final   Platelet Count  Date Value Ref Range Status  08/18/2017 270 145 - 400 K/uL Final   Creatinine  Date Value Ref Range Status  08/18/2017 0.84 0.44 - 1.00 mg/dL Final  95/62/1308 0.7 0.6 - 1.1 mg/dL Final   Creatinine, Ser  Date Value Ref Range Status  11/11/2022 0.79 0.44 - 1.00 mg/dL Final   AST  Date Value Ref Range Status  11/11/2022 22 15 - 41 U/L  Final  08/18/2017 13 (L) 15 - 41 U/L Final  04/09/2013 17 5 - 34 U/L Final   ALT  Date Value Ref Range Status  11/11/2022 23 0 - 44 U/L Final  08/18/2017 13 0 - 44 U/L Final  04/09/2013 9 0 - 55 U/L Final   CA125 (10/2022): 96.6  Surgical pathology (10/28/2017):

## 2022-12-16 NOTE — H&P (View-Only) (Signed)
GYNECOLOGIC ONCOLOGY NEW PATIENT CONSULTATION  Date of Service: 12/16/2022 Referring Provider: Eugenie Birks, FNP   ASSESSMENT AND PLAN: Rebecca Castillo is a 47 y.o. woman with a 3cm solid right adnexal mass (O-RADS 4) and elevated CA125 (96.6).  We reviewed that the exact etiology of the pelvic mass is unclear, but could include a benign, borderline, or malignant process.  The recommended treatment is surgical excision to make a definitive diagnosis.  Based on her reported history of intraoperative findings of endometriosis that were verbally shared with her as well as the mildly elevated CA125, it is possible that this complex mass represents an endometrioma.  However, recommend some additional tumor markers to further evaluate with CEA, CA 19-9, inhibin B.  Prior CT scan does not show any other evidence of concern.  Because the mass is relatively small, we feel that a minimally invasive approach is feasible, using robotic assistance.    In the event of malignancy or borderline tumor on frozen section, we will perform indicated staging procedures. We discussed that these procedures may include omentectomy pelvic and/or para-aortic lymphadenectomy, peritoneal biopsies. We would also remove any tissue concerning for metastatic disease which could require additional procedures including bowel surgery.  Patient was consented for: Robotic assisted unilateral salpingo-oophorectomy, possible bilateral salpingo-oophorectomy, possible staging, possible laparotomy on 01/14/23.  Given that she has not started to have perimenopausal symptoms, if the mass is benign, we reviewed retaining her contralateral ovary.  However will obtain [facility] today to help guide in this decision making given her age close to average age of menopause.  But, if evidence of borderline or malignant process on frozen pathology, would recommend removal of contralateral ovary.  The risks of surgery were discussed in detail and she  understands these to including but not limited to bleeding requiring a blood transfusion, infection, injury to adjacent organs (including but not limited to the bowels, bladder, ureters, nerves, blood vessels), thromboembolic events, wound separation, hernia, possible risk of lymphedema and lymphocyst if lymphadenectomy performed, unforseen complication, and possible need for re-exploration.  If the patient experiences any of these events, she understands that her hospitalization or recovery may be prolonged and that she may need to take additional medications for a prolonged period. The patient will receive DVT and antibiotic prophylaxis as indicated. She voiced a clear understanding. She had the opportunity to ask questions and informed consent was obtained today. She wishes to proceed.  She will proceed to the lab today for CEA, CA19-9, Inhibin B, FSH. She does not require preoperative clearance. Her METs are >4.  All preoperative instructions were reviewed. Postoperative expectations were also reviewed. Written handouts were provided to the patient.   A copy of this note was sent to the patient's referring provider.  Clide Cliff, MD Gynecologic Oncology   Medical Decision Making I personally spent  TOTAL 60 minutes face-to-face and non-face-to-face in the care of this patient, which includes all pre, intra, and post visit time on the date of service.   ------------  CC: Pelvic mass  HISTORY OF PRESENT ILLNESS:  [name] is a 47 y.o. woman who is seen in consultation at the request of Eugenie Birks, FNP for evaluation of pelvic mass.  Patient presented to the emergency department on 11/11/2022 for evaluation of abdominal pain.  She underwent CT abdomen/pelvis which showed asymmetric enlargement of the right ovary with complex mixed density measuring 3.8 x 3.4 cm.  She then underwent a pelvic ultrasound which showed a 2.3 cm complex right ovarian cyst  with solid components.  She  then presented to her OB/GYN for follow-up.  Pain is intermittent throbbing that radiates to the back for the past month.  She has a history of prior abdominal hysterectomy for endometriosis.  A CA125 and MRI were ordered.  CA125 returned at 96.6.  MRI pelvis showed a solid mass in the right ovary with homogenous contrast-enhancement measuring 3 x 2.2 cm.  Per the MRI report, and retrospective review this was noted to be present dating back to 09/28/2019 with very slow enlargement over time.  This was noted as a O-RADS 4.  No lymphadenopathy or other evidence of metastatic disease.  Today, patient reports that she had pain in 1 month prior to her emergency department visit.  She reports it was aching.  She treated this with Aleve and it went away.  The pain then recurred 3 days prior to her ED visit and lasted 3 days.  The pain has again gone away since her ED visit and has not come back.  She denies otherwise abdominal bloating, early satiety, significant weight loss, change in bowel or bladder habits.  She denies having any hot flashes or night sweats at this time.  Patient reports she underwent an abdominal hysterectomy with Dr. Huntley Dec in 2019 for heavy vaginal bleeding.  She notes that she was told she had endometriosis that was found at the time of her surgery.  Pathology results reviewed.  No mention of endometriosis on pathology.  Operative report was obtained following patient's visit (TAH, BSO, extensive adhesiolysis).  Operative report notes that below the level of the umbilicus there were multiple adhesions of the omentum to the anterior abdominal wall.  Additionally, a portion of the uterine fundus was seen to be densely adherent to the anterior uterine wall and the bilateral adnexa were densely encased in omentum as well.  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    PAST MEDICAL HISTORY: Past Medical History:  Diagnosis Date    Family history of early CAD 07/01/2011   Iron deficiency anemia    received Feraheme infusion 04/09/2013   PONV (postoperative nausea and vomiting)    Umbilical hernia 05/2013    PAST SURGICAL HISTORY: Past Surgical History:  Procedure Laterality Date   ABDOMINAL HYSTERECTOMY  2019   still with tubes and ovaries   CESAREAN SECTION     x 4   MULTIPLE TOOTH EXTRACTIONS     abscesses , wisdom teeth as well   UMBILICAL HERNIA REPAIR  2019    OB/GYN HISTORY: OB History  Gravida Para Term Preterm AB Living  4 4 4     4   SAB IAB Ectopic Multiple Live Births          4    # Outcome Date GA Lbr Len/2nd Weight Sex Type Anes PTL Lv  4 Term      CS-Unspec   LIV  3 Term      CS-Unspec   LIV  2 Term      CS-Unspec   LIV  1 Term      CS-Unspec   LIV      Age at menarche: 52 Age at menopause: 32 Hx of HRT: no Hx of STI: HSV Last pap: 03/21/21 NILM History of abnormal pap smears: none  SCREENING STUDIES:  Last mammogram: 02/2022 Last colonoscopy: Needs to reschedule her first one  MEDICATIONS:  Current Outpatient Medications:    naproxen (NAPROSYN) 500 MG tablet,  Take 1 tablet by mouth 2 (two) times daily., Disp: , Rfl:    valACYclovir (VALTREX) 500 MG tablet, Take 500 mg by mouth 2 (two) times daily. (Patient not taking: Reported on 10/24/2022), Disp: , Rfl:   ALLERGIES: Allergies  Allergen Reactions   Oxycodone Other (See Comments)    DIZZINESS    FAMILY HISTORY: Family History  Problem Relation Age of Onset   Kidney disease Mother    Hypertension Mother    Hyperlipidemia Mother    Heart disease Mother    Heart attack Mother        27   Stroke Mother    Healthy Father        murder   Lung cancer Maternal Grandmother    Throat cancer Maternal Grandmother    Breast cancer Neg Hx    Prostate cancer Neg Hx    Pancreatic cancer Neg Hx    Ovarian cancer Neg Hx    Endometrial cancer Neg Hx    Colon cancer Neg Hx     SOCIAL HISTORY: Social History   Socioeconomic  History   Marital status: Single    Spouse name: Not on file   Number of children: Not on file   Years of education: Not on file   Highest education level: Not on file  Occupational History    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  Tobacco Use   Smoking status: Never   Smokeless tobacco: Never  Vaping Use   Vaping status: Never Used  Substance and Sexual Activity   Alcohol use: Yes    Comment: occasionally   Drug use: No   Sexual activity: Yes    Partners: Male    Birth control/protection: Surgical  Other Topics Concern   Not on file  Social History Narrative   Two boys two girls    Four c sections   Social Determinants of Health   Financial Resource Strain: Not on file  Food Insecurity: No Food Insecurity (12/13/2022)   Hunger Vital Sign    Worried About Running Out of Food in the Last Year: Never true    Ran Out of Food in the Last Year: Never true  Transportation Needs: No Transportation Needs (12/13/2022)   PRAPARE - Administrator, Civil Service (Medical): No    Lack of Transportation (Non-Medical): No  Physical Activity: Not on file  Stress: Not on file  Social Connections: Not on file  Intimate Partner Violence: Not At Risk (12/13/2022)   Humiliation, Afraid, Rape, and Kick questionnaire    Fear of Current or Ex-Partner: No    Emotionally Abused: No    Physically Abused: No    Sexually Abused: No    REVIEW OF SYSTEMS: New patient intake form was reviewed.  Complete 10-system review is negative except for the following: none  PHYSICAL EXAM: BP (!) 172/101 (BP Location: Left Arm) Comment: MD and RN notified  Pulse 64   Temp 98.4 F (36.9 C)   Resp 18   Ht 5\' 7"  (1.702 m)   Wt 203 lb (92.1 kg)   LMP 10/28/2016   BMI 31.79 kg/m  Constitutional: No acute distress. Neuro/Psych: Alert, oriented.  Head and Neck: Normocephalic, atraumatic. Neck symmetric without masses. Sclera anicteric.  Respiratory: Normal work of  breathing. Clear to auscultation bilaterally. Cardiovascular: Regular rate and rhythm, no murmurs, rubs, or gallops. Abdomen: Normoactive bowel sounds. Soft, non-distended, non-tender to palpation. No masses appreciated.Well healed pfannenstiel and umbilical incisions. Extremities: Grossly  normal range of motion. Warm, well perfused. No edema bilaterally. Skin: No rashes or lesions. Lymphatic: No cervical, supraclavicular, or inguinal adenopathy. Genitourinary: External genitalia without lesions. Urethral meatus without lesions or prolapse. On speculum exam, vagina without lesions. Bimanual exam reveals normal vaginal mucosa. Small right adnexal mass. No palpable left adnexal mass. No TTP. Exam chaperoned by Warner Mccreedy, NP   LABORATORY AND RADIOLOGIC DATA: Outside medical records were reviewed to synthesize the above history, along with the history and physical obtained during the visit.  Outside laboratory, pathology, and imaging reports were reviewed, with pertinent results below.  I personally reviewed the outside images.  WBC  Date Value Ref Range Status  11/11/2022 9.6 4.0 - 10.5 K/uL Final   Hemoglobin  Date Value Ref Range Status  11/11/2022 13.2 12.0 - 15.0 g/dL Final  16/11/9602 54.0 11.6 - 15.9 g/dL Final   HGB  Date Value Ref Range Status  10/01/2013 11.5 (L) 11.6 - 15.9 g/dL Final   HCT  Date Value Ref Range Status  11/11/2022 39.8 36.0 - 46.0 % Final  10/01/2013 35.4 34.8 - 46.6 % Final   Platelets  Date Value Ref Range Status  11/11/2022 346 150 - 400 K/uL Final  10/01/2013 274 145 - 400 10e3/uL Final   Platelet Count  Date Value Ref Range Status  08/18/2017 270 145 - 400 K/uL Final   Creatinine  Date Value Ref Range Status  08/18/2017 0.84 0.44 - 1.00 mg/dL Final  98/12/9145 0.7 0.6 - 1.1 mg/dL Final   Creatinine, Ser  Date Value Ref Range Status  11/11/2022 0.79 0.44 - 1.00 mg/dL Final   AST  Date Value Ref Range Status  11/11/2022 22 15 - 41 U/L  Final  08/18/2017 13 (L) 15 - 41 U/L Final  04/09/2013 17 5 - 34 U/L Final   ALT  Date Value Ref Range Status  11/11/2022 23 0 - 44 U/L Final  08/18/2017 13 0 - 44 U/L Final  04/09/2013 9 0 - 55 U/L Final   CA125 (10/2022): 96.6  Surgical pathology (10/28/2017):

## 2022-12-17 ENCOUNTER — Other Ambulatory Visit: Payer: Self-pay | Admitting: Gynecologic Oncology

## 2022-12-17 DIAGNOSIS — N83201 Unspecified ovarian cyst, right side: Secondary | ICD-10-CM

## 2022-12-17 LAB — CANCER ANTIGEN 19-9: CA 19-9: 9 U/mL (ref 0–35)

## 2022-12-17 LAB — FOLLICLE STIMULATING HORMONE: FSH: 4.9 m[IU]/mL

## 2022-12-18 LAB — INHIBIN B: Inhibin B: <7 pg/mL

## 2022-12-19 ENCOUNTER — Encounter: Payer: Self-pay | Admitting: Obstetrics and Gynecology

## 2022-12-23 ENCOUNTER — Ambulatory Visit: Payer: BC Managed Care – PPO | Admitting: Psychiatry

## 2023-01-05 NOTE — Progress Notes (Signed)
COVID Vaccine received:  []  No [x]  Yes Date of any COVID positive Test in last 90 days:  PCP - Mort Sawyers, FNP  Cardiologist - none  Chest x-ray - 2013  2v  Epic EKG -  09-18-2021  Epic Stress Test -  ECHO -  Cardiac Cath -   PCR screen: []  Ordered & Completed []   No Order but Needs PROFEND     [x]   N/A for this surgery  Surgery Plan:  [x]  Ambulatory   []  Outpatient in bed  []  Admit Anesthesia:    [x]  General  []  Spinal  []   Choice []   MAC  Bowel Prep - []  No  []   Yes _GYN Diet_____  Pacemaker / ICD device [x]  No []  Yes   Spinal Cord Stimulator:[x]  No []  Yes       History of Sleep Apnea? [x]  No []  Yes   CPAP used?- [x]  No []  Yes    Does the patient monitor blood sugar?   [x]  N/A   []  No []  Yes  Patient has: [x]  NO Hx DM   []  Pre-DM   []  DM1  []   DM2 Last A1c was:        on       Blood Thinner / Instructions: None Aspirin Instructions:  None  ERAS Protocol Ordered: []  No  [x]  Yes PRE-SURGERY []  ENSURE  []  G2   [x]  No Drink Ordered Patient is to be NPO after: 08:30  Dental hx: []  Dentures:  []  N/A      []  Bridge or Partial:                   []  Loose or Damaged teeth:   Comments:   Activity level: Patient is able / unable to climb a flight of stairs without difficulty; []  No CP  []  No SOB, but would have ___   Patient can / can not perform ADLs without assistance.   Anesthesia review: anemia, Palps (family hx of early CAD), PONV  Patient denies shortness of breath, fever, cough and chest pain at PAT appointment.  Patient verbalized understanding and agreement to the Pre-Surgical Instructions that were given to them at this PAT appointment. Patient was also educated of the need to review these PAT instructions again prior to her surgery.I reviewed the appropriate phone numbers to call if they have any and questions or concerns.

## 2023-01-05 NOTE — Patient Instructions (Signed)
SURGICAL WAITING ROOM VISITATION Patients having surgery or a procedure may have no more than 2 support people in the waiting area - these visitors may rotate in the visitor waiting room.   Due to an increase in RSV and influenza rates and associated hospitalizations, children ages 33 and under may not visit patients in [facility] hospitals. If the patient needs to stay at the hospital during part of their recovery, the visitor guidelines for inpatient rooms apply.  PRE-OP VISITATION  Pre-op nurse will coordinate an appropriate time for 1 support person to accompany the patient in pre-op.  This support person may not rotate.  This visitor will be contacted when the time is appropriate for the visitor to come back in the pre-op area.  Please refer to the [facility] website for the visitor guidelines for Inpatients (after your surgery is over and you are in a regular room).  You are not required to quarantine at this time prior to your surgery. However, you must do this: Hand Hygiene often Do NOT share personal items Notify your provider if you are in close contact with someone who has COVID or you develop fever 100.4 or greater, new onset of sneezing, cough, sore throat, shortness of breath or body aches.  If you test positive for Covid or have been in contact with anyone that has tested positive in the last 10 days please notify you surgeon.    Your procedure is scheduled on:  Tuesday  January 14, 2023  Report to [facility] Main Entrance: [facility] entrance where the [employer] is available.   Report to admitting at: 09:15    AM  Call this number if you have any questions or problems the morning of surgery [phone]  Do not eat food after Midnight the night prior to your surgery/procedure.  After Midnight you may have the following liquids until  08:30  AM  DAY OF SURGERY  Clear Liquid Diet Water Black Coffee (sugar ok, NO MILK/CREAM OR CREAMERS)  Tea (sugar ok, NO  MILK/CREAM OR CREAMERS) regular and decaf                             Plain Jell-O  with no fruit (NO RED)                                           Fruit ices (not with fruit pulp, NO RED)                                     Popsicles (NO RED)                                                                  Juice: NO CITRUS JUICES: only apple, WHITE grape, WHITE cranberry Sports drinks like Gatorade or Powerade (NO RED)                 FOLLOW ANY ADDITIONAL PRE OP INSTRUCTIONS YOU RECEIVED FROM YOUR SURGEON'S OFFICE!!!  Eat a light diet the day before  surgery.  Examples including soups, broths, toast, yogurt, mashed potatoes.  AVOID GAS PRODUCING FOODS. Things to avoid include carbonated beverages (fizzy beverages, sodas), raw fruits and raw vegetables (uncooked), or beans.     Oral Hygiene is also important to reduce your risk of infection.        Remember - BRUSH YOUR TEETH THE MORNING OF SURGERY WITH YOUR REGULAR TOOTHPASTE  Do NOT smoke after Midnight the night before surgery.  STOP TAKING all Vitamins, Herbs and supplements 1 week before your surgery.   Take ONLY these medicines the morning of surgery with A SIP OF WATER: none                    You may not have any metal on your body including hair pins, jewelry, and body piercing  Do not wear make-up, lotions, powders, perfumes  or deodorant  Do not wear nail polish including gel and S&S, artificial / acrylic nails, or any other type of covering on natural nails including finger and toenails. If you have artificial nails, gel coating, etc., that needs to be removed by a nail salon, Please have this removed prior to surgery. Not doing so may mean that your surgery could be cancelled or delayed if the Surgeon or anesthesia staff feels like they are unable to monitor you safely.   Do not shave 48 hours prior to surgery to avoid nicks in your skin which may contribute to postoperative infections.   Contacts, Hearing Aids, dentures  or bridgework may not be worn into surgery. DENTURES WILL BE REMOVED PRIOR TO SURGERY PLEASE DO NOT APPLY "Poly grip" OR ADHESIVES!!!  Patients discharged on the day of surgery will not be allowed to drive home.  Someone NEEDS to stay with you for the first 24 hours after anesthesia.  Do not bring your home medications to the hospital. The Pharmacy will dispense medications listed on your medication list to you during your admission in the Hospital.  Special Instructions: Bring a copy of your healthcare power of attorney and living will documents the day of surgery, if you wish to have them scanned into your [facility] Records- EPIC  Please read over the following fact sheets you were given: IF YOU HAVE QUESTIONS ABOUT YOUR PRE-OP INSTRUCTIONS, PLEASE CALL [phone]   [facility] - Preparing for Surgery Before surgery, you can play an important role.  Because skin is not sterile, your skin needs to be as free of germs as possible.  You can reduce the number of germs on your skin by washing with CHG (chlorahexidine gluconate) soap before surgery.  CHG is an antiseptic cleaner which kills germs and bonds with the skin to continue killing germs even after washing. Please DO NOT use if you have an allergy to CHG or antibacterial soaps.  If your skin becomes reddened/irritated stop using the CHG and inform your nurse when you arrive at Short Stay. Do not shave (including legs and underarms) for at least 48 hours prior to the first CHG shower.  You may shave your face/neck.  Please follow these instructions carefully:  1.  Shower with CHG Soap the night before surgery and the  morning of surgery.  2.  If you choose to wash your hair, wash your hair first as usual with your normal  shampoo.  3.  After you shampoo, rinse your hair and body thoroughly to remove the shampoo.  4.  Use CHG as you would any other liquid soap.  You can apply chg directly to the skin and  wash.  Gently with a scrungie or clean washcloth.  5.  Apply the CHG Soap to your body ONLY FROM THE NECK DOWN.   Do not use on face/ open                           Wound or open sores. Avoid contact with eyes, ears mouth and genitals (private parts).                       Wash face,  Genitals (private parts) with your normal soap.             6.  Wash thoroughly, paying special attention to the area where your  surgery  will be performed.  7.  Thoroughly rinse your body with warm water from the neck down.  8.  DO NOT shower/wash with your normal soap after using and rinsing off the CHG Soap.            9.  Pat yourself dry with a clean towel.            10.  Wear clean pajamas.            11.  Place clean sheets on your bed the night of your first shower and do not  sleep with pets.  ON THE DAY OF SURGERY : Do not apply any lotions/deodorants the morning of surgery.  Please wear clean clothes to the hospital/surgery center.     FAILURE TO FOLLOW THESE INSTRUCTIONS MAY RESULT IN THE CANCELLATION OF YOUR SURGERY  PATIENT SIGNATURE_________________________________  NURSE SIGNATURE__________________________________  ________________________________________________________________________    Rebecca Castillo    An incentive spirometer is a tool that can help keep your lungs clear and active. This tool measures how well you are filling your lungs with each breath. Taking long deep breaths may help reverse or decrease the chance of developing breathing (pulmonary) problems (especially infection) following: A long period of time when you are unable to move or be active. BEFORE THE PROCEDURE  If the spirometer includes an indicator to show your best effort, your nurse or respiratory therapist will set it to a desired goal. If possible, sit up straight or lean slightly forward. Try not to slouch. Hold the incentive spirometer in an upright position. INSTRUCTIONS FOR USE  Sit on the  edge of your bed if possible, or sit up as far as you can in bed or on a chair. Hold the incentive spirometer in an upright position. Breathe out normally. Place the mouthpiece in your mouth and seal your lips tightly around it. Breathe in slowly and as deeply as possible, raising the piston or the ball toward the top of the column. Hold your breath for 3-5 seconds or for as long as possible. Allow the piston or ball to fall to the bottom of the column. Remove the mouthpiece from your mouth and breathe out normally. Rest for a few seconds and repeat Steps 1 through 7 at least 10 times every 1-2 hours when you are awake. Take your time and take a few normal breaths between deep breaths. The spirometer may include an indicator to show your best effort. Use the indicator as a goal to work toward during each repetition. After each set of 10 deep breaths, practice coughing to be sure your  lungs are clear. If you have an incision (the cut made at the time of surgery), support your incision when coughing by placing a pillow or rolled up towels firmly against it. Once you are able to get out of bed, walk around indoors and cough well. You may stop using the incentive spirometer when instructed by your caregiver.  RISKS AND COMPLICATIONS Take your time so you do not get dizzy or light-headed. If you are in pain, you may need to take or ask for pain medication before doing incentive spirometry. It is harder to take a deep breath if you are having pain. AFTER USE Rest and breathe slowly and easily. It can be helpful to keep track of a log of your progress. Your caregiver can provide you with a simple table to help with this. If you are using the spirometer at home, follow these instructions: SEEK MEDICAL CARE IF:  You are having difficultly using the spirometer. You have trouble using the spirometer as often as instructed. Your pain medication is not giving enough relief while using the spirometer. You  develop fever of 100.5 F (38.1 C) or higher.                                                                                                    SEEK IMMEDIATE MEDICAL CARE IF:  You cough up bloody sputum that had not been present before. You develop fever of 102 F (38.9 C) or greater. You develop worsening pain at or near the incision site. MAKE SURE YOU:  Understand these instructions. Will watch your condition. Will get help right away if you are not doing well or get worse. Document Released: 06/17/2006 Document Revised: 04/29/2011 Document Reviewed: 08/18/2006 [facility] Patient Information 2014 [place], [place].

## 2023-01-06 ENCOUNTER — Other Ambulatory Visit: Payer: Self-pay

## 2023-01-06 ENCOUNTER — Encounter (HOSPITAL_COMMUNITY)
Admission: RE | Admit: 2023-01-06 | Discharge: 2023-01-06 | Disposition: A | Discharge status: Home / Self Care | Payer: BC Managed Care – PPO | Source: Ambulatory Visit | Attending: Psychiatry | Admitting: Psychiatry

## 2023-01-06 ENCOUNTER — Encounter (HOSPITAL_COMMUNITY): Payer: Self-pay

## 2023-01-06 VITALS — BP 144/94 | HR 74 | Temp 99.2°F | Resp 16 | Ht 67.0 in | Wt 199.0 lb

## 2023-01-06 DIAGNOSIS — Z01812 Encounter for preprocedural laboratory examination: Secondary | ICD-10-CM | POA: Insufficient documentation

## 2023-01-06 DIAGNOSIS — R002 Palpitations: Secondary | ICD-10-CM | POA: Insufficient documentation

## 2023-01-06 DIAGNOSIS — Z01818 Encounter for other preprocedural examination: Secondary | ICD-10-CM

## 2023-01-06 DIAGNOSIS — Z01810 Encounter for preprocedural cardiovascular examination: Secondary | ICD-10-CM | POA: Diagnosis not present

## 2023-01-06 DIAGNOSIS — N83209 Unspecified ovarian cyst, unspecified side: Secondary | ICD-10-CM | POA: Diagnosis not present

## 2023-01-06 DIAGNOSIS — Z8249 Family history of ischemic heart disease and other diseases of the circulatory system: Secondary | ICD-10-CM | POA: Diagnosis not present

## 2023-01-06 HISTORY — DX: Essential (primary) hypertension: I10

## 2023-01-06 LAB — COMPREHENSIVE METABOLIC PANEL
ALT: 22 U/L (ref 0–44)
AST: 16 U/L (ref 15–41)
Albumin: 4 g/dL (ref 3.5–5.0)
Alkaline Phosphatase: 65 U/L (ref 38–126)
Anion gap: 8 (ref 5–15)
BUN: 11 mg/dL (ref 6–20)
CO2: 24 mmol/L (ref 22–32)
Calcium: 8.5 mg/dL — ABNORMAL LOW (ref 8.9–10.3)
Chloride: 103 mmol/L (ref 98–111)
Creatinine, Ser: 0.75 mg/dL (ref 0.44–1.00)
GFR, Estimated: >60 mL/min (ref >60)
Glucose, Bld: 85 mg/dL (ref 70–99)
Potassium: 3.7 mmol/L (ref 3.5–5.1)
Sodium: 135 mmol/L (ref 135–145)
Total Bilirubin: 0.4 mg/dL (ref <1.2)
Total Protein: 7.4 g/dL (ref 6.5–8.1)

## 2023-01-06 LAB — CBC
HCT: 38.3 % (ref 36.0–46.0)
Hemoglobin: 12.7 g/dL (ref 12.0–15.0)
MCH: 29.4 pg (ref 26.0–34.0)
MCHC: 33.2 g/dL (ref 30.0–36.0)
MCV: 88.7 fL (ref 80.0–100.0)
Platelets: 318 10*3/uL (ref 150–400)
RBC: 4.32 MIL/uL (ref 3.87–5.11)
RDW: 13.2 % (ref 11.5–15.5)
WBC: 8.6 10*3/uL (ref 4.0–10.5)
nRBC: 0 % (ref 0.0–0.2)

## 2023-01-13 ENCOUNTER — Telehealth: Payer: Self-pay

## 2023-01-13 NOTE — Telephone Encounter (Signed)
LVM for patient to call office regarding pre-op instructions/date/time for upcoming surgery tomorrow 11/26

## 2023-01-13 NOTE — Telephone Encounter (Signed)
Telephone call to check on pre-operative status.  Patient compliant with pre-operative instructions.  Reinforced nothing to eat after midnight. Clear liquids until 0830. Patient to arrive at 0915.  No questions or concerns voiced.  Instructed to call for any needs.

## 2023-01-14 ENCOUNTER — Other Ambulatory Visit: Payer: Self-pay

## 2023-01-14 ENCOUNTER — Encounter (HOSPITAL_COMMUNITY): Payer: Self-pay | Admitting: Psychiatry

## 2023-01-14 ENCOUNTER — Encounter (HOSPITAL_COMMUNITY)
Admission: RE | Disposition: A | Discharge status: Home / Self Care | Payer: Self-pay | Source: Home / Self Care | Attending: Psychiatry

## 2023-01-14 ENCOUNTER — Ambulatory Visit (HOSPITAL_COMMUNITY)
Admission: RE | Admit: 2023-01-14 | Discharge: 2023-01-14 | Disposition: A | Discharge status: Home / Self Care | Payer: BC Managed Care – PPO | Attending: Psychiatry | Admitting: Psychiatry

## 2023-01-14 ENCOUNTER — Ambulatory Visit (HOSPITAL_COMMUNITY): Payer: Self-pay | Admitting: Registered Nurse

## 2023-01-14 DIAGNOSIS — N83209 Unspecified ovarian cyst, unspecified side: Secondary | ICD-10-CM | POA: Diagnosis not present

## 2023-01-14 DIAGNOSIS — N80101 Endometriosis of right ovary, unspecified depth: Secondary | ICD-10-CM | POA: Diagnosis not present

## 2023-01-14 DIAGNOSIS — N736 Female pelvic peritoneal adhesions (postinfective): Secondary | ICD-10-CM | POA: Insufficient documentation

## 2023-01-14 DIAGNOSIS — N83291 Other ovarian cyst, right side: Secondary | ICD-10-CM | POA: Diagnosis not present

## 2023-01-14 DIAGNOSIS — N83201 Unspecified ovarian cyst, right side: Secondary | ICD-10-CM | POA: Diagnosis not present

## 2023-01-14 DIAGNOSIS — K660 Peritoneal adhesions (postprocedural) (postinfection): Secondary | ICD-10-CM | POA: Diagnosis not present

## 2023-01-14 DIAGNOSIS — N8003 Adenomyosis of the uterus: Secondary | ICD-10-CM | POA: Diagnosis not present

## 2023-01-14 DIAGNOSIS — I10 Essential (primary) hypertension: Secondary | ICD-10-CM | POA: Diagnosis not present

## 2023-01-14 HISTORY — PX: ROBOTIC ASSISTED SALPINGO OOPHERECTOMY: SHX6082

## 2023-01-14 LAB — ABO/RH: ABO/RH(D): O POS

## 2023-01-14 LAB — TYPE AND SCREEN
ABO/RH(D): O POS
Antibody Screen: NEGATIVE

## 2023-01-14 SURGERY — SALPINGO-OOPHORECTOMY, ROBOT-ASSISTED
Anesthesia: General

## 2023-01-14 MED ORDER — FENTANYL CITRATE (PF) 100 MCG/2ML IJ SOLN
INTRAMUSCULAR | Status: DC | PRN
  Administered 2023-01-14: 50 ug via INTRAVENOUS
  Administered 2023-01-14: 100 ug via INTRAVENOUS

## 2023-01-14 MED ORDER — LIDOCAINE HCL (CARDIAC) PF 100 MG/5ML IV SOSY
PREFILLED_SYRINGE | INTRAVENOUS | Status: DC | PRN
  Administered 2023-01-14: 100 mg via INTRAVENOUS

## 2023-01-14 MED ORDER — ROCURONIUM BROMIDE 10 MG/ML (PF) SYRINGE
PREFILLED_SYRINGE | INTRAVENOUS | Status: DC | PRN
  Administered 2023-01-14 (×2): 10 mg via INTRAVENOUS
  Administered 2023-01-14: 60 mg via INTRAVENOUS
  Administered 2023-01-14: 10 mg via INTRAVENOUS

## 2023-01-14 MED ORDER — DEXMEDETOMIDINE HCL IN NACL 80 MCG/20ML IV SOLN
INTRAVENOUS | Status: DC | PRN
  Administered 2023-01-14 (×2): 4 ug via INTRAVENOUS
  Administered 2023-01-14: 8 ug via INTRAVENOUS

## 2023-01-14 MED ORDER — ACETAMINOPHEN 500 MG PO TABS
1000.0000 mg | ORAL_TABLET | ORAL | Status: AC
  Administered 2023-01-14: 1000 mg via ORAL
  Filled 2023-01-14: qty 2

## 2023-01-14 MED ORDER — ORAL CARE MOUTH RINSE: 15.0000 mL | Freq: Once | OROMUCOSAL | Status: AC

## 2023-01-14 MED ORDER — BUPIVACAINE HCL 0.25 % IJ SOLN
INTRAMUSCULAR | Status: DC | PRN
  Administered 2023-01-14: 28 mL

## 2023-01-14 MED ORDER — SUGAMMADEX SODIUM 200 MG/2ML IV SOLN
INTRAVENOUS | Status: DC | PRN
  Administered 2023-01-14: 300 mg via INTRAVENOUS

## 2023-01-14 MED ORDER — DEXAMETHASONE SODIUM PHOSPHATE 10 MG/ML IJ SOLN
INTRAMUSCULAR | Status: AC
  Filled 2023-01-14: qty 1

## 2023-01-14 MED ORDER — ROCURONIUM BROMIDE 100 MG/10ML IV SOLN: INTRAVENOUS | Status: DC | PRN

## 2023-01-14 MED ORDER — PROPOFOL 10 MG/ML IV BOLUS
INTRAVENOUS | Status: DC | PRN
  Administered 2023-01-14: 25 ug/kg/min via INTRAVENOUS
  Administered 2023-01-14: 200 mg via INTRAVENOUS

## 2023-01-14 MED ORDER — DROPERIDOL 2.5 MG/ML IJ SOLN: 0.6250 mg | Freq: Once | INTRAMUSCULAR | Status: DC | PRN

## 2023-01-14 MED ORDER — HEPARIN SODIUM (PORCINE) 5000 UNIT/ML IJ SOLN
5000.0000 [IU] | INTRAMUSCULAR | Status: AC
  Administered 2023-01-14: 5000 [IU] via SUBCUTANEOUS
  Filled 2023-01-14: qty 1

## 2023-01-14 MED ORDER — ONDANSETRON HCL 4 MG/2ML IJ SOLN
INTRAMUSCULAR | Status: DC | PRN
  Administered 2023-01-14: 4 mg via INTRAVENOUS

## 2023-01-14 MED ORDER — ROCURONIUM BROMIDE 10 MG/ML (PF) SYRINGE
PREFILLED_SYRINGE | INTRAVENOUS | Status: AC
  Filled 2023-01-14: qty 10

## 2023-01-14 MED ORDER — LACTATED RINGERS IV SOLN: INTRAVENOUS | Status: DC

## 2023-01-14 MED ORDER — GABAPENTIN 300 MG PO CAPS
300.0000 mg | ORAL_CAPSULE | ORAL | Status: AC
  Administered 2023-01-14: 300 mg via ORAL
  Filled 2023-01-14: qty 1

## 2023-01-14 MED ORDER — ONDANSETRON HCL 4 MG/2ML IJ SOLN
INTRAMUSCULAR | Status: AC
  Filled 2023-01-14: qty 2

## 2023-01-14 MED ORDER — PROPOFOL 500 MG/50ML IV EMUL
INTRAVENOUS | Status: AC
  Filled 2023-01-14: qty 50

## 2023-01-14 MED ORDER — KETAMINE HCL 50 MG/5ML IJ SOSY
PREFILLED_SYRINGE | INTRAMUSCULAR | Status: DC | PRN
  Administered 2023-01-14: 10 mg via INTRAVENOUS
  Administered 2023-01-14: 20 mg via INTRAVENOUS

## 2023-01-14 MED ORDER — CHLORHEXIDINE GLUCONATE 0.12 % MT SOLN
15.0000 mL | Freq: Once | OROMUCOSAL | Status: AC
  Administered 2023-01-14: 15 mL via OROMUCOSAL

## 2023-01-14 MED ORDER — SODIUM CHLORIDE 0.9% FLUSH: 3.0000 mL | Freq: Two times a day (BID) | INTRAVENOUS | Status: DC

## 2023-01-14 MED ORDER — HYDROMORPHONE HCL 1 MG/ML IJ SOLN: 0.2500 mg | INTRAMUSCULAR | Status: DC | PRN

## 2023-01-14 MED ORDER — SCOPOLAMINE 1 MG/3DAYS TD PT72
1.0000 | MEDICATED_PATCH | TRANSDERMAL | Status: DC
  Administered 2023-01-14: 1.5 mg via TRANSDERMAL
  Filled 2023-01-14: qty 1

## 2023-01-14 MED ORDER — ONDANSETRON HCL 4 MG/2ML IJ SOLN: 4.0000 mg | Freq: Once | INTRAMUSCULAR | Status: DC | PRN

## 2023-01-14 MED ORDER — ACETAMINOPHEN 10 MG/ML IV SOLN
INTRAVENOUS | Status: AC
  Filled 2023-01-14: qty 100

## 2023-01-14 MED ORDER — FENTANYL CITRATE (PF) 250 MCG/5ML IJ SOLN
INTRAMUSCULAR | Status: AC
  Filled 2023-01-14: qty 5

## 2023-01-14 MED ORDER — ACETAMINOPHEN 10 MG/ML IV SOLN: INTRAVENOUS | Status: DC | PRN

## 2023-01-14 MED ORDER — LIDOCAINE HCL (PF) 2 % IJ SOLN
INTRAMUSCULAR | Status: AC
  Filled 2023-01-14: qty 5

## 2023-01-14 MED ORDER — DEXAMETHASONE SODIUM PHOSPHATE 10 MG/ML IJ SOLN
INTRAMUSCULAR | Status: DC | PRN
  Administered 2023-01-14: 5 mg via INTRAVENOUS

## 2023-01-14 MED ORDER — DEXAMETHASONE SODIUM PHOSPHATE 4 MG/ML IJ SOLN
4.0000 mg | INTRAMUSCULAR | Status: DC
Start: 2023-01-14 — End: 2023-01-14

## 2023-01-14 MED ORDER — PROPOFOL 10 MG/ML IV BOLUS
INTRAVENOUS | Status: AC
  Filled 2023-01-14: qty 20

## 2023-01-14 MED ORDER — KETAMINE HCL 50 MG/5ML IJ SOSY
PREFILLED_SYRINGE | INTRAMUSCULAR | Status: AC
  Filled 2023-01-14: qty 5

## 2023-01-14 MED ORDER — HYDROCODONE-ACETAMINOPHEN 7.5-325 MG PO TABS: 1.0000 | ORAL_TABLET | Freq: Once | ORAL | Status: DC | PRN

## 2023-01-14 MED ORDER — 0.9 % SODIUM CHLORIDE (POUR BTL) OPTIME
TOPICAL | Status: DC | PRN
  Administered 2023-01-14: 1000 mL

## 2023-01-14 MED ORDER — KETAMINE HCL 10 MG/ML IJ SOLN: INTRAMUSCULAR | Status: DC | PRN

## 2023-01-14 MED ORDER — MIDAZOLAM HCL 5 MG/5ML IJ SOLN
INTRAMUSCULAR | Status: DC | PRN
  Administered 2023-01-14: 2 mg via INTRAVENOUS

## 2023-01-14 MED ORDER — MIDAZOLAM HCL 2 MG/2ML IJ SOLN
INTRAMUSCULAR | Status: AC
  Filled 2023-01-14: qty 2

## 2023-01-14 MED ORDER — BUPIVACAINE HCL 0.25 % IJ SOLN
INTRAMUSCULAR | Status: AC
  Filled 2023-01-14: qty 1

## 2023-01-14 SURGICAL SUPPLY — 78 items
APPLICATOR SURGIFLO ENDO (HEMOSTASIS) IMPLANT
BAG LAPAROSCOPIC 12 15 PORT 16 (BASKET) IMPLANT
BAG RETRIEVAL 12/15 (BASKET) ×1
BLADE SURG SZ10 CARB STEEL (BLADE) IMPLANT
CNTNR URN SCR LID CUP LEK RST (MISCELLANEOUS) IMPLANT
COVER BACK TABLE 60X90IN (DRAPES) ×1 IMPLANT
COVER TIP SHEARS 8 DVNC (MISCELLANEOUS) ×1 IMPLANT
DERMABOND ADVANCED .7 DNX12 (GAUZE/BANDAGES/DRESSINGS) ×1 IMPLANT
DRAPE ARM DVNC X/XI (DISPOSABLE) ×4 IMPLANT
DRAPE COLUMN DVNC XI (DISPOSABLE) ×1 IMPLANT
DRAPE SHEET LG 3/4 BI-LAMINATE (DRAPES) ×1 IMPLANT
DRAPE SURG IRRIG POUCH 19X23 (DRAPES) ×1 IMPLANT
DRIVER NDL MEGA SUTCUT DVNCXI (INSTRUMENTS) ×1 IMPLANT
DRIVER NDLE MEGA SUTCUT DVNCXI (INSTRUMENTS)
DRSG OPSITE POSTOP 4X6 (GAUZE/BANDAGES/DRESSINGS) IMPLANT
DRSG OPSITE POSTOP 4X8 (GAUZE/BANDAGES/DRESSINGS) IMPLANT
ELECT PENCIL ROCKER SW 15FT (MISCELLANEOUS) IMPLANT
ELECT REM PT RETURN 15FT ADLT (MISCELLANEOUS) ×1 IMPLANT
FORCEPS BPLR FENES DVNC XI (FORCEP) ×1 IMPLANT
FORCEPS PROGRASP DVNC XI (FORCEP) ×1 IMPLANT
GAUZE 4X4 16PLY ~~LOC~~+RFID DBL (SPONGE) ×1 IMPLANT
GLOVE BIO SURGEON STRL SZ 6 (GLOVE) ×4 IMPLANT
GLOVE BIO SURGEON STRL SZ 6.5 (GLOVE) ×1 IMPLANT
GLOVE BIOGEL PI IND STRL 6.5 (GLOVE) ×2 IMPLANT
GOWN STRL REUS W/ TWL LRG LVL3 (GOWN DISPOSABLE) ×4 IMPLANT
GRASPER SUT TROCAR 14GX15 (MISCELLANEOUS) IMPLANT
HOLDER FOLEY CATH W/STRAP (MISCELLANEOUS) IMPLANT
IRRIG SUCT STRYKERFLOW 2 WTIP (MISCELLANEOUS) ×1
IRRIGATION SUCT STRKRFLW 2 WTP (MISCELLANEOUS) ×1 IMPLANT
KIT PROCEDURE DVNC SI (MISCELLANEOUS) IMPLANT
KIT TURNOVER KIT A (KITS) IMPLANT
LIGASURE IMPACT 36 18CM CVD LR (INSTRUMENTS) IMPLANT
MANIPULATOR ADVINCU DEL 3.0 PL (MISCELLANEOUS) IMPLANT
MANIPULATOR ADVINCU DEL 3.5 PL (MISCELLANEOUS) IMPLANT
MANIPULATOR UTERINE 4.5 ZUMI (MISCELLANEOUS) IMPLANT
NDL HYPO 21X1.5 SAFETY (NEEDLE) ×1 IMPLANT
NDL INSUFFLATION 14GA 120MM (NEEDLE) IMPLANT
NDL SPNL 20GX3.5 QUINCKE YW (NEEDLE) IMPLANT
NEEDLE HYPO 21X1.5 SAFETY (NEEDLE) ×1
NEEDLE INSUFFLATION 14GA 120MM (NEEDLE) ×1
NEEDLE SPNL 20GX3.5 QUINCKE YW (NEEDLE)
NS IRRIG 1000ML POUR BTL (IV SOLUTION) IMPLANT
OBTURATOR OPTICAL STND 8 DVNC (TROCAR) ×1
OBTURATOR OPTICALSTD 8 DVNC (TROCAR) ×1 IMPLANT
PACK ROBOT GYN CUSTOM WL (TRAY / TRAY PROCEDURE) ×1 IMPLANT
PAD ARMBOARD 7.5X6 YLW CONV (MISCELLANEOUS) ×1 IMPLANT
PAD POSITIONING PINK XL (MISCELLANEOUS) ×1 IMPLANT
PORT ACCESS TROCAR AIRSEAL 12 (TROCAR) IMPLANT
SCISSORS LAP 5X35 DISP (ENDOMECHANICALS) IMPLANT
SCISSORS MNPLR CVD DVNC XI (INSTRUMENTS) ×1 IMPLANT
SCRUB CHG 4% DYNA-HEX 4OZ (MISCELLANEOUS) ×2 IMPLANT
SEAL UNIV 5-12 XI (MISCELLANEOUS) ×4 IMPLANT
SET TRI-LUMEN FLTR TB AIRSEAL (TUBING) ×1 IMPLANT
SPIKE FLUID TRANSFER (MISCELLANEOUS) ×1 IMPLANT
SPONGE T-LAP 18X18 ~~LOC~~+RFID (SPONGE) IMPLANT
SURGIFLO W/THROMBIN 8M KIT (HEMOSTASIS) IMPLANT
SUT MNCRL AB 4-0 PS2 18 (SUTURE) IMPLANT
SUT PDS AB 1 TP1 54 (SUTURE) IMPLANT
SUT VIC AB 0 CT1 27XBRD ANTBC (SUTURE) IMPLANT
SUT VIC AB 2-0 CT1 TAPERPNT 27 (SUTURE) IMPLANT
SUT VIC AB 3-0 SH 27XBRD (SUTURE) IMPLANT
SUT VIC AB 4-0 PS2 18 (SUTURE) ×2 IMPLANT
SUT VICRYL 0 27 CT2 27 ABS (SUTURE) ×1 IMPLANT
SUT VLOC 180 0 9IN GS21 (SUTURE) IMPLANT
SYR 10ML LL (SYRINGE) IMPLANT
SYR TOOMEY IRRIG 70ML (MISCELLANEOUS) ×1
SYRINGE TOOMEY IRRIG 70ML (MISCELLANEOUS) IMPLANT
SYS BAG RETRIEVAL 10MM (BASKET)
SYS WOUND ALEXIS 18CM MED (MISCELLANEOUS)
SYSTEM BAG RETRIEVAL 10MM (BASKET) IMPLANT
SYSTEM WOUND ALEXIS 18CM MED (MISCELLANEOUS) IMPLANT
TOWEL OR NON WOVEN STRL DISP B (DISPOSABLE) IMPLANT
TRAP SPECIMEN MUCUS 40CC (MISCELLANEOUS) IMPLANT
TRAY FOLEY MTR SLVR 16FR STAT (SET/KITS/TRAYS/PACK) ×1 IMPLANT
TROCAR PORT AIRSEAL 5X120 (TROCAR) IMPLANT
UNDERPAD 30X36 HEAVY ABSORB (UNDERPADS AND DIAPERS) ×2 IMPLANT
WATER STERILE IRR 1000ML POUR (IV SOLUTION) ×1 IMPLANT
YANKAUER SUCT BULB TIP 10FT TU (MISCELLANEOUS) IMPLANT

## 2023-01-14 NOTE — Interval H&P Note (Signed)
History and Physical Interval Note:  01/14/2023 9:52 AM  Rebecca Castillo  has presented today for surgery, with the diagnosis of OVARIAN CYST.  The various methods of treatment have been discussed with the patient and family. After consideration of risks, benefits and other options for treatment, the patient has consented to  Procedure(s): XI ROBOTIC ASSISTED UNILATERAL VS. BILATERAL SALPINGO OOPHORECTOMY (N/A) POSSIBLE STAGING (N/A) as a surgical intervention.  The patient's history has been reviewed, patient examined, no change in status, stable for surgery.  I have reviewed the patient's chart and labs.  Questions were answered to the patient's satisfaction.     Himmat Enberg

## 2023-01-14 NOTE — Transfer of Care (Signed)
Immediate Anesthesia Transfer of Care Note  Patient: Taeja Patmon  Procedure(s) Performed: XI ROBOTIC ASSISTED RIGHT SALPINGO OOPHORECTOMY AND LYSIS OF ADHESIONS  Patient Location: PACU  Anesthesia Type:General  Level of Consciousness: drowsy and patient cooperative  Airway & Oxygen Therapy: Patient connected to face mask oxygen  Post-op Assessment: Report given to RN and Post -op Vital signs reviewed and stable  Post vital signs: Reviewed and stable  Last Vitals:  Vitals Value Taken Time  BP 148/88 01/14/23 1418  Temp    Pulse 78 01/14/23 1419  Resp 9 01/14/23 1419  SpO2 97 % 01/14/23 1419  Vitals shown include unfiled device data.  Last Pain:  Vitals:   01/14/23 1001  TempSrc:   PainSc: 3          Complications: There were no known notable events for this encounter.

## 2023-01-14 NOTE — Anesthesia Postprocedure Evaluation (Signed)
Anesthesia Post Note  Patient: Rebecca Castillo  Procedure(s) Performed: XI ROBOTIC ASSISTED RIGHT SALPINGO OOPHORECTOMY AND LYSIS OF ADHESIONS     Patient location during evaluation: PACU Anesthesia Type: General Level of consciousness: awake and alert and oriented Pain management: pain level controlled Vital Signs Assessment: post-procedure vital signs reviewed and stable Respiratory status: spontaneous breathing, nonlabored ventilation and respiratory function stable Cardiovascular status: blood pressure returned to baseline and stable Postop Assessment: no apparent nausea or vomiting Anesthetic complications: no   There were no known notable events for this encounter.  Last Vitals:  Vitals:   01/14/23 1445 01/14/23 1500  BP: (!) 140/98 (!) 141/105  Pulse: 80 82  Resp: 13 17  Temp:    SpO2: 94% 95%    Last Pain:  Vitals:   01/14/23 1500  TempSrc:   PainSc: 0-No pain                 Ione Sandusky A.

## 2023-01-14 NOTE — Anesthesia Preprocedure Evaluation (Addendum)
Anesthesia Evaluation  Patient identified by MRN, date of birth, ID band Patient awake    Reviewed: Allergy & Precautions, NPO status , Patient's Chart, lab work & pertinent test results, reviewed documented beta blocker date and time   History of Anesthesia Complications (+) PONV and history of anesthetic complications  Airway Mallampati: II  TM Distance: >3 FB     Dental no notable dental hx. (+) Missing, Dental Advisory Given, Chipped   Pulmonary neg pulmonary ROS   Pulmonary exam normal breath sounds clear to auscultation       Cardiovascular hypertension, Normal cardiovascular exam Rhythm:Regular Rate:Normal     Neuro/Psych negative neurological ROS  negative psych ROS   GI/Hepatic negative GI ROS, Neg liver ROS,,,  Endo/Other  negative endocrine ROS    Renal/GU negative Renal ROS  negative genitourinary   Musculoskeletal   Abdominal   Peds  Hematology  (+) Blood dyscrasia, anemia   Anesthesia Other Findings   Reproductive/Obstetrics Ovarian Cyst RLQ pain                             Anesthesia Physical Anesthesia Plan  ASA: 2  Anesthesia Plan: General   Post-op Pain Management: Ketamine IV*, Ofirmev IV (intra-op)* and Precedex   Induction: Intravenous  PONV Risk Score and Plan: 4 or greater and Treatment may vary due to age or medical condition, Scopolamine patch - Pre-op, Midazolam, Dexamethasone and Ondansetron  Airway Management Planned: Oral ETT  Additional Equipment: None  Intra-op Plan:   Post-operative Plan: Extubation in OR  Informed Consent: I have reviewed the patients History and Physical, chart, labs and discussed the procedure including the risks, benefits and alternatives for the proposed anesthesia with the patient or authorized representative who has indicated his/her understanding and acceptance.     Dental advisory given  Plan Discussed with:  Anesthesiologist and CRNA  Anesthesia Plan Comments:         Anesthesia Quick Evaluation

## 2023-01-14 NOTE — Discharge Instructions (Addendum)
AFTER SURGERY INSTRUCTIONS   Return to work: 4-6 weeks if applicable   Activity: 1. Be up and out of the bed during the day.  Take a nap if needed.  You may walk up steps but be careful and use the hand rail.  Stair climbing will tire you more than you think, you may need to stop part way and rest.    2. No lifting or straining for 6 weeks over 10 pounds. No pushing, pulling, straining for 6 weeks.   3. No driving for around 1 week(s).  Do not drive if you are taking narcotic pain medicine and make sure that your reaction time has returned.    4. You can shower as soon as the next day after surgery. Shower daily.  Use your regular soap and water (not directly on the incision) and pat your incision(s) dry afterwards; don't rub.  No tub baths or submerging your body in water until cleared by your surgeon. If you have the soap that was given to you by pre-surgical testing that was used before surgery, you do not need to use it afterwards because this can irritate your incisions.    5. No sexual activity and nothing in the vagina for 6 weeks.   6. You may experience a small amount of clear drainage from your incisions, which is normal.  If the drainage persists, increases, or changes color please call the office.   7. Do not use creams, lotions, or ointments such as neosporin on your incisions after surgery until advised by your surgeon because they can cause removal of the dermabond glue on your incisions.     8. Take Tylenol or ibuprofen first for pain if you are able to take these medications and only use narcotic pain medication for severe pain not relieved by the Tylenol or Ibuprofen.  Monitor your Tylenol intake to a max of 4,000 mg in a 24 hour period. You can alternate these medications after surgery.   Diet: 1. Low sodium Heart Healthy Diet is recommended but you are cleared to resume your normal (before surgery) diet after your procedure.   2. It is safe to use a laxative, such as  Miralax or Colace, if you have difficulty moving your bowels. You have been prescribed Sennakot-S to take at bedtime every evening after surgery to keep bowel movements regular and to prevent constipation.     Wound Care: 1. Keep clean and dry.  Shower daily.   Reasons to call the Doctor: Fever - Oral temperature greater than 100.4 degrees Fahrenheit Foul-smelling vaginal discharge Difficulty urinating Nausea and vomiting Increased pain at the site of the incision that is unrelieved with pain medicine. Difficulty breathing with or without chest pain New calf pain especially if only on one side Sudden, continuing increased vaginal bleeding with or without clots.   Contacts: For questions or concerns you should contact:   Dr. Clide Cliff at [phone]   Warner Mccreedy, NP at [phone]   After Hours: call [phone] and have the GYN Oncologist paged/contacted (after 5 pm or on the weekends). You will speak with an after hours RN and let he or she know you have had surgery.   Messages sent via mychart are for non-urgent matters and are not responded to after hours so for urgent needs, please call the after hours number.

## 2023-01-14 NOTE — Anesthesia Procedure Notes (Signed)
Procedure Name: Intubation Date/Time: 01/14/2023 11:19 AM  Performed by: Sharyn Dross, CRNAPre-anesthesia Checklist: Patient identified, Emergency Drugs available, Suction available and Patient being monitored Patient Re-evaluated:Patient Re-evaluated prior to induction Oxygen Delivery Method: Circle system utilized Preoxygenation: Pre-oxygenation with 100% oxygen Induction Type: IV induction Ventilation: Mask ventilation without difficulty and Oral airway inserted - appropriate to patient size Laryngoscope Size: Mac and 4 Grade View: Grade I Tube type: Oral Tube size: 7.0 mm Number of attempts: 1 Airway Equipment and Method: Stylet and Oral airway Placement Confirmation: ETT inserted through vocal cords under direct vision, positive ETCO2 and breath sounds checked- equal and bilateral Secured at: 22 cm Tube secured with: Tape Dental Injury: Teeth and Oropharynx as per pre-operative assessment

## 2023-01-14 NOTE — Op Note (Signed)
GYNECOLOGIC ONCOLOGY OPERATIVE NOTE  Date of Service: 01/14/2023  Preoperative Diagnosis: Right ovarian complex cyst  Postoperative Diagnosis: Right ovarian complex cyst  Procedures: Robotic assisted right salpingo-oophorectomy, lysis of adhesions (Modifer 22: increased duration of the procedure by >82min due to complexity due adhesive disease requiring extensive meticulous lysis of adhesions, necessitating additional instrumentation for retraction and safe exposure)  Surgeon: Clide Cliff, MD  Assistants: Antionette Char, MD and (an MD assistant was necessary for tissue manipulation, management of robotic instrumentation, retraction and positioning due to the complexity of the case and hospital policies)  Anesthesia: General  Estimated Blood Loss: 25 mL    Fluids: 1400 ml, crystalloid  Urine Output: 600 ml, clear yellow  Findings: On entry to abdomen, normal upper abdominal survey with smooth diaphragm, liver, stomach and normal appearing bowel.  Multiple adhesions of the omentum to the anterior abdominal wall, inferior to the umbilicus.  Dense adhesions of the omentum to the right adnexa.  Multiple loops of small bowel adherent to the pelvis along the bladder and vaginal cuff.  Portion of small bowel adherent to the right adnexa.  Right adnexa densely adherent to the right pelvic sidewall with portion of cyst retroperitonealized.  Normal left ovary with portion of attached fallopian tube with hydrosalpinx. IOFS c/w benign cyst, possible endometriotic cyst.   Specimens:  ID Type Source Tests Collected by Time Destination  1 : RIGHT TUBE AND OVARY Tissue PATH Soft tissue SURGICAL PATHOLOGY Clide Cliff, MD 01/14/2023 1204   A : PELVIC WASHINGS Body Fluid PATH Cytology Pelvic Washing CYTOLOGY - NON PAP Clide Cliff, MD 01/14/2023 1139     Complications:  None  Indications for Procedure: Rebecca Castillo is a 47 y.o. woman with a complex right adnexal mass (O-RADS 4) and  elevated CA-125.  Prior to the procedure, all risks, benefits, and alternatives were discussed and informed surgical consent was signed.  Procedure: Patient was taken to the operating room where general anesthesia was achieved.  She was positioned in dorsal lithotomy and prepped and draped.  A foley catheter was inserted into the bladder.   A 12 mm incision was made in the left upper quadrant near Palmer's point.  The abdomen was entered with a 5 mm OptiView trocar under direct visualization.  The abdomen was insufflated, the patient placed in steep Trendelenburg, and additional trocars were placed as follows: an 8mm robotic trocar superior to the umbilicus, two 8 mm robotic trocars in the right abdomen, and one 8 mm robotic trocar in the left abdomen.  The left upper quadrant trocar was removed and replaced with a 12 mm airseal trocar.  All trocars were placed under direct visualization.    Initial lysis of adhesions of the omentum to the anterior abdominal wall was performed with laparoscopic scissors sharply, and with judicious use of electrocautery. The DaVinci robotic surgical system was brought to the patient's bedside and docked.  Washings were obtained.  Additional extensive lysis of adhesions was performed to continue to mobilize the omentum off of the anterior abdominal wall as well as off of the right adnexa.  Multiple loops of small bowel were noted to be adherent to the pelvis.  Using cold sharp dissection, the small bowel was mobilized off of the right adnexa.  The mesentery of the small bowel was also adherent to the adnexa and this was mobilized sharply.    The peritoneum overlying the right pelvic sidewall was incised and the right retroperitoneum entered.  The right ureter was identified.  The  right infundibulopelvic ligament was isolated, cauterized, and transected.  Using a combination of sharp and blunt dissection, the adnexa was then serially mobilized off of the right pelvic  sidewall.  A portion of the adnexal mass was noted to be densely adherent to the underlying retroperitoneum and remnant round ligament.  Structures such as the ureter and external iliac vessels were identified and kept safe throughout this dissection.  With the adnexa freed from the right sidewall, it was placed in an Endo Catch bag and removed through the assistant trocar, requiring some morcellation within the bag for removal.  The pelvis was inspected and all operative sites were found to be hemostatic.  The small bowel that was mobilized off of the adnexa was inspected and noted to be intact.  Additional adhesions of the small bowel to the pelvis were left in place.  All instruments were removed and the robot was taken from the patient's bedside. The fascia at the 12 mm incision was closed with 0 Vicryl with the assistance of a PMI device. The abdomen was desufflated and all ports were removed. The skin at all incisions was closed with 4-0 Vicryl to reapproximate the subcutaneous tissue and 4-0 monocryl in a subcuticular fashion followed by surgical glue.  Patient tolerated the procedure well. Sponge, lap, and instrument counts were correct.  No perioperative antibiotic prophylaxis was indicated for this procedure.  She was extubated and taken to the PACU in stable condition.  Clide Cliff, MD Gynecologic Oncology

## 2023-01-15 ENCOUNTER — Encounter (HOSPITAL_COMMUNITY): Payer: Self-pay | Admitting: Psychiatry

## 2023-01-15 ENCOUNTER — Telehealth: Payer: Self-pay | Admitting: *Deleted

## 2023-01-15 NOTE — Telephone Encounter (Signed)
Spoke with Rebecca Castillo this morning. She states she is eating, drinking and urinating well. She has not had a BM yet but is passing some gas. Pt is having some difficulty passing gas, encouraged warm liquids, like mint herbal tea and mylicon chews over the counter and walking. She is taking senokot as prescribed and encouraged her to drink plenty of water. She denies fever or chills. Incisions are dry and intact. She rates her pain 0/10. Today, and she only took one tramadol last night but today she hasn't needed to take anything for pain.   Instructed to call office with any fever, chills, purulent drainage, uncontrolled pain or any other questions or concerns. Patient verbalizes understanding.   Pt aware of post op appointments as well as the office number [phone] and after hours number [phone] to call if she has any questions or concerns

## 2023-01-17 LAB — SURGICAL PATHOLOGY

## 2023-01-18 NOTE — Progress Notes (Signed)
noted 

## 2023-01-20 LAB — CYTOLOGY - NON PAP

## 2023-01-20 NOTE — Progress Notes (Signed)
noted 

## 2023-01-27 ENCOUNTER — Inpatient Hospital Stay: Payer: BC Managed Care – PPO | Attending: Psychiatry | Admitting: Psychiatry

## 2023-01-27 VITALS — BP 142/96 | HR 66 | Temp 99.0°F | Resp 20 | Wt 200.6 lb

## 2023-01-27 DIAGNOSIS — Z90721 Acquired absence of ovaries, unilateral: Secondary | ICD-10-CM

## 2023-01-27 DIAGNOSIS — N83201 Unspecified ovarian cyst, right side: Secondary | ICD-10-CM

## 2023-01-27 DIAGNOSIS — Z9079 Acquired absence of other genital organ(s): Secondary | ICD-10-CM

## 2023-01-27 NOTE — Progress Notes (Signed)
Gynecologic Oncology Return Clinic Visit  Date of Service: 01/27/2023 Referring Provider: Eugenie Birks, FNP   Assessment & Plan: Rebecca Castillo is a 47 y.o. woman with a complex right ovarian cyst who is s/p RA-RSO, lysis of adhesions on 01/14/23 with benign final pathology.  Postop: - Pt recovering well from surgery and healing appropriately postoperatively - Intraoperative findings and pathology results reviewed. - Ongoing postoperative expectations and precautions reviewed. - Pt works as a Merchandiser, retail. Okay to return to work in 2 weeks. - Okay to return to routine care  RTC PRN.  Clide Cliff, MD Gynecologic Oncology    ----------------------- Reason for Visit: Postop  Interval History: Pt reports that she is recovering well from surgery. She is sore, but pain improved; not needing narcotics. She is eating and drinking well. She is voiding without issue and having regular bowel movements. Appetite improving since surgery.    Past Medical/Surgical History: Past Medical History:  Diagnosis Date   Family history of early CAD 07/01/2011   Hypertension    Iron deficiency anemia    blood loss, had hysterectomy 2019, no anemia since   PONV (postoperative nausea and vomiting)    Umbilical hernia 05/2013    Past Surgical History:  Procedure Laterality Date   CESAREAN SECTION     x 4   MULTIPLE TOOTH EXTRACTIONS     abscesses , wisdom teeth as well   ROBOTIC ASSISTED SALPINGO OOPHERECTOMY N/A 01/14/2023   Procedure: XI ROBOTIC ASSISTED RIGHT SALPINGO OOPHORECTOMY AND LYSIS OF ADHESIONS;  Surgeon: Clide Cliff, MD;  Location: [facility];  Service: Gynecology;  Laterality: N/A;   UMBILICAL HERNIA REPAIR  2019    Family History  Problem Relation Age of Onset   Kidney disease Mother    Hypertension Mother    Hyperlipidemia Mother    Heart disease Mother    Heart attack Mother        16   Stroke Mother    Healthy Father        murder   Lung cancer Maternal  Grandmother    Throat cancer Maternal Grandmother    Breast cancer Neg Hx    Prostate cancer Neg Hx    Pancreatic cancer Neg Hx    Ovarian cancer Neg Hx    Endometrial cancer Neg Hx    Colon cancer Neg Hx     Social History   Socioeconomic History   Marital status: Single    Spouse name: Not on file   Number of children: Not on file   Years of education: Not on file   Highest education level: GED or equivalent  Occupational History    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  Tobacco Use   Smoking status: Never   Smokeless tobacco: Never  Vaping Use   Vaping status: Never Used  Substance and Sexual Activity   Alcohol use: Yes    Comment: occasionally   Drug use: No   Sexual activity: Yes    Partners: Male    Birth control/protection: Surgical  Other Topics Concern   Not on file  Social History Narrative   Two boys two girls    Four c sections   Social Drivers of Health   Financial Resource Strain: Low Risk  (01/27/2023)   Overall Financial Resource Strain (CARDIA)    Difficulty of Paying Living Expenses: Not hard at all  Food Insecurity: No Food Insecurity (01/27/2023)   Hunger Vital Sign    Worried About Running Out of  Food in the Last Year: Never true    Ran Out of Food in the Last Year: Never true  Transportation Needs: No Transportation Needs (01/27/2023)   PRAPARE - Administrator, Civil Service (Medical): No    Lack of Transportation (Non-Medical): No  Physical Activity: Insufficiently Active (01/27/2023)   Exercise Vital Sign    Days of Exercise per Week: 1 day    Minutes of Exercise per Session: 30 min  Stress: No Stress Concern Present (01/27/2023)   [employer] of Occupational Health - Occupational Stress Questionnaire    Feeling of Stress : Not at all  Social Connections: Moderately Isolated (01/27/2023)   Social Connection and Isolation Panel [NHANES]    Frequency of Communication with Friends and Family: More than  three times a week    Frequency of Social Gatherings with Friends and Family: Once a week    Attends Religious Services: More than 4 times per year    Active Member of [employer] or Organizations: No    Attends Engineer, structural: Not on file    Marital Status: Never married    Current Medications:  Current Outpatient Medications:    amLODipine (NORVASC) 5 MG tablet, Take 1 tablet (5 mg total) by mouth daily., Disp: 90 tablet, Rfl: 0   atorvastatin (LIPITOR) 10 MG tablet, Take 1 tablet (10 mg total) by mouth daily., Disp: 90 tablet, Rfl: 3  Review of Symptoms: Complete 10-system review is negative except as above in Interval History.  Physical Exam: BP (!) 142/96 Comment: Manual recheck, NP notified, pt to monitor and follow up with PCP  Pulse 66   Temp 99 F (37.2 C) (Oral)   Resp 20   Wt 200 lb 9.6 oz (91 kg)   LMP 10/28/2016   SpO2 100%   BMI 31.42 kg/m  General: Alert, oriented, no acute distress. HEENT: Normocephalic, atraumatic. Neck symmetric without masses. Sclera anicteric.  Chest: Normal work of breathing. Clear to auscultation bilaterally.   Cardiovascular: Regular rate and rhythm, no murmurs. Abdomen: Soft, nontender.  Normoactive bowel sounds.  No masses appreciated.  Well-healing incision with glue. Extremities: Grossly normal range of motion.  Warm, well perfused.  No edema bilaterally. Skin: No rashes or lesions noted.  Laboratory & Radiologic Studies: Surgical pathology (01/14/23): A. OVARY AND FALLOPIAN TUBE, RIGHT, SALPINGO OOPHORECTOMY:  - Benign cyst with hemosiderin containing macrophages, consistent with  an endometriotic cyst  - Endometriosis  - Portion of adherent unremarkable fallopian tube  - No evidence of malignancy   Cytology (01/14/23): FINAL MICROSCOPIC DIAGNOSIS:  - No malignant cells identified

## 2023-01-28 ENCOUNTER — Encounter: Payer: Self-pay | Admitting: Family

## 2023-01-28 ENCOUNTER — Encounter: Payer: Self-pay | Admitting: *Deleted

## 2023-01-28 ENCOUNTER — Ambulatory Visit: Payer: BC Managed Care – PPO | Admitting: Family

## 2023-01-28 VITALS — BP 138/100 | HR 67 | Temp 98.7°F | Ht 67.0 in | Wt 200.0 lb

## 2023-01-28 DIAGNOSIS — Z23 Encounter for immunization: Secondary | ICD-10-CM | POA: Diagnosis not present

## 2023-01-28 DIAGNOSIS — E7800 Pure hypercholesterolemia, unspecified: Secondary | ICD-10-CM | POA: Diagnosis not present

## 2023-01-28 DIAGNOSIS — E669 Obesity, unspecified: Secondary | ICD-10-CM | POA: Diagnosis not present

## 2023-01-28 DIAGNOSIS — Z8342 Family history of familial hypercholesterolemia: Secondary | ICD-10-CM | POA: Diagnosis not present

## 2023-01-28 DIAGNOSIS — D500 Iron deficiency anemia secondary to blood loss (chronic): Secondary | ICD-10-CM | POA: Diagnosis not present

## 2023-01-28 DIAGNOSIS — Z0001 Encounter for general adult medical examination with abnormal findings: Secondary | ICD-10-CM | POA: Diagnosis not present

## 2023-01-28 DIAGNOSIS — I10 Essential (primary) hypertension: Secondary | ICD-10-CM | POA: Diagnosis not present

## 2023-01-28 DIAGNOSIS — R0683 Snoring: Secondary | ICD-10-CM | POA: Diagnosis not present

## 2023-01-28 DIAGNOSIS — E538 Deficiency of other specified B group vitamins: Secondary | ICD-10-CM | POA: Diagnosis not present

## 2023-01-28 LAB — IBC + FERRITIN
Ferritin: 69.4 ng/mL (ref 10.0–291.0)
Iron: 82 ug/dL (ref 42–145)
Saturation Ratios: 24.3 % (ref 20.0–50.0)
TIBC: 337.4 ug/dL (ref 250.0–450.0)
Transferrin: 241 mg/dL (ref 212.0–360.0)

## 2023-01-28 LAB — BASIC METABOLIC PANEL
BUN: 12 mg/dL (ref 6–23)
CO2: 27 meq/L (ref 19–32)
Calcium: 8.9 mg/dL (ref 8.4–10.5)
Chloride: 103 meq/L (ref 96–112)
Creatinine, Ser: 0.9 mg/dL (ref 0.40–1.20)
GFR: 75.4 mL/min (ref >60.00)
Glucose, Bld: 88 mg/dL (ref 70–99)
Potassium: 4.4 meq/L (ref 3.5–5.1)
Sodium: 137 meq/L (ref 135–145)

## 2023-01-28 LAB — LIPID PANEL
Cholesterol: 196 mg/dL (ref 0–200)
HDL: 36.4 mg/dL — ABNORMAL LOW (ref >39.00)
LDL Cholesterol: 138 mg/dL — ABNORMAL HIGH (ref 0–99)
NonHDL: 159.66
Total CHOL/HDL Ratio: 5
Triglycerides: 110 mg/dL (ref 0.0–149.0)
VLDL: 22 mg/dL (ref 0.0–40.0)

## 2023-01-28 LAB — MICROALBUMIN / CREATININE URINE RATIO
Creatinine,U: 224.5 mg/dL
Microalb Creat Ratio: 0.6 mg/g (ref 0.0–30.0)
Microalb, Ur: 1.3 mg/dL (ref 0.0–1.9)

## 2023-01-28 LAB — VITAMIN B12: Vitamin B-12: 239 pg/mL (ref 211–911)

## 2023-01-28 MED ORDER — AMLODIPINE BESYLATE 5 MG PO TABS: 5.0000 mg | ORAL_TABLET | Freq: Every day | ORAL | 0 refills | Status: DC

## 2023-01-28 NOTE — Assessment & Plan Note (Signed)
Sleep study sent, pending results

## 2023-01-28 NOTE — Patient Instructions (Signed)
  Stop by the lab prior to leaving today. I will notify you of your results once received.   Recommendations on keeping yourself healthy:  - Exercise at least 30-45 minutes a day, 3-4 days a week.  - Eat a low-fat diet with lots of fruits and vegetables, up to 7-9 servings per day.  - Seatbelts can save your life. Wear them always.  - Smoke detectors on every level of your home, check batteries every year.  - Eye Doctor - have an eye exam every 1-2 years  - Safe sex - if you may be exposed to STDs, use a condom.  - Alcohol -  If you drink, do it moderately, less than 2 drinks per day.  - Health Care Power of Attorney. Choose someone to speak for you if you are not able.  - Depression is common in our stressful world.If you're feeling down or losing interest in things you normally enjoy, please come in for a visit.  - Violence - If anyone is threatening or hurting you, please call immediately.  Due to recent changes in healthcare laws, you may see results of your imaging and/or laboratory studies on MyChart before I have had a chance to review them.  I understand that in some cases there may be results that are confusing or concerning to you. Please understand that not all results are received at the same time and often I may need to interpret multiple results in order to provide you with the best plan of care or course of treatment. Therefore, I ask that you please give me 2 business days to thoroughly review all your results before contacting my office for clarification. Should we see a critical lab result, you will be contacted sooner.   I will see you again in one year for your annual comprehensive exam unless otherwise stated and or with acute concerns.  It was a pleasure seeing you today! Please do not hesitate to reach out with any questions and or concerns.  Regards,   Yarah Fuente    

## 2023-01-28 NOTE — Assessment & Plan Note (Signed)
Lpa and lipid panel ordered Work on low cholesterol diet and exercise as tolerated

## 2023-01-28 NOTE — Assessment & Plan Note (Signed)
Start vitamin B12 500 mcg  Checking b12 as well via labs. Pending results.

## 2023-01-28 NOTE — Progress Notes (Signed)
Subjective:  Patient ID: Rebecca Castillo, female    DOB: 07/31/1975  Age: 47 y.o. MRN: [id number]  Patient Care Team: Mort Sawyers, FNP as PCP - General (Family Medicine)   CC:  Chief Complaint  Patient presents with   Annual Exam    HPI Rebecca Castillo is a 47 y.o. female who presents today for an annual physical exam. She reports consuming a general diet. The patient does not participate in regular exercise at present. She generally feels well. She reports sleeping fairly well. She does have additional problems to discuss today.   Vision:Within last year Dental:Receives regular dental care  Mammogram: 02/2022 ok per pt.  Last pap: no h/o abn paps Partial hysterectomy, only still with left ovary and fallopian tubes  Colonoscopy: had prepped for colonoscopy but was unsuccessful, needs to reschedule.  TDAP: is ok with taking today.   Pt is with acute concerns.   Right ovarian complex cyst surgically removed/along with salpingo oophorectomy 11/26 and pathology was benign. Recent f/u with Dr. Alvester Morin, general surgeon and all went well. Does see Dr[facility] Huntley [facility], [facility] as well.   Elevated blood pressure without hypertension, was checking blood pressure at home as well. A few months ago did go for a pre consult for colonoscopy and elevated blood pressure. Again today 152/96, she doesn't think she is taking it correctly at home. All it does is make her worry when she goes to check it.  Mom did have hypertension as well her family members. Does have increased stress at work which she feels can affect the blood pressure as well. Does get headaches at times will wake her up with her sleep, more frequent recently than usual. Due again this year for her eye exam.    Advanced Directives Patient does not have advanced directives    DEPRESSION SCREENING    04/01/2022   11:25 AM 12/11/2021    7:41 AM 01/20/2017    4:05 PM 10/01/2013    8:33 AM  PHQ 2/9 Scores  PHQ - 2  Score 0 0 0 0     ROS: Negative unless specifically indicated above in HPI.    Current Outpatient Medications:    amLODipine (NORVASC) 5 MG tablet, Take 1 tablet (5 mg total) by mouth daily., Disp: 90 tablet, Rfl: 0    Objective:    BP (!) 138/100   Pulse 67   Temp 98.7 F (37.1 C) (Temporal)   Ht 5\' 7"  (1.702 m)   Wt 200 lb (90.7 kg)   LMP 10/28/2016   SpO2 98%   BMI 31.32 kg/m   BP Readings from Last 3 Encounters:  01/28/23 (!) 138/100  01/27/23 (!) 142/96  01/14/23 (!) 146/99      Physical Exam Constitutional:      General: She is not in acute distress.    Appearance: Normal appearance. She is normal weight. She is not ill-appearing.  HENT:     Head: Normocephalic.     Right Ear: Tympanic membrane normal.     Left Ear: Tympanic membrane normal.     Nose: Nose normal.     Mouth/Throat:     Mouth: Mucous membranes are moist.  Eyes:     Extraocular Movements: Extraocular movements intact.     Pupils: Pupils are equal, round, and reactive to light.  Cardiovascular:     Rate and Rhythm: Normal rate and regular rhythm.  Pulmonary:     Effort: Pulmonary effort is normal.  Breath sounds: Normal breath sounds.  Abdominal:     General: Abdomen is flat. Bowel sounds are normal.     Palpations: Abdomen is soft.     Tenderness: There is no guarding or rebound.  Musculoskeletal:        General: Normal range of motion.     Cervical back: Normal range of motion.  Skin:    General: Skin is warm.     Capillary Refill: Capillary refill takes less than 2 seconds.  Neurological:     General: No focal deficit present.     Mental Status: She is alert.  Psychiatric:        Mood and Affect: Mood normal.        Behavior: Behavior normal.        Thought Content: Thought content normal.        Judgment: Judgment normal.          Assessment & Plan:  Snoring Assessment & Plan: Sleep study sent, pending results  Orders: -     Ambulatory referral to Sleep  Studies  Obesity (BMI 30-39.9) -     Ambulatory referral to Sleep Studies  Vitamin B12 deficiency Assessment & Plan: Start vitamin B12 500 mcg  Checking b12 as well via labs. Pending results.    Orders: -     Vitamin B12  Iron deficiency anemia due to chronic blood loss -     IBC + Ferritin; Future  Primary hypertension Assessment & Plan: Start amlodipine 5 mg once daily. Pt advised of the following:  Continue medication as prescribed. Monitor blood pressure periodically and/or when you feel symptomatic. Goal is <130/90 on average. Ensure that you have rested for 30 minutes prior to checking your blood pressure. Record your readings and bring them to your next visit if necessary.work on a low sodium diet.   Orders: -     Microalbumin / creatinine urine ratio -     Basic metabolic panel -     amLODIPine Besylate; Take 1 tablet (5 mg total) by mouth daily.  Dispense: 90 tablet; Refill: 0  Elevated LDL cholesterol level Assessment & Plan: Lpa and lipid panel ordered Work on low cholesterol diet and exercise as tolerated   Orders: -     Lipid panel -     Lipoprotein A (LPA)  Encounter for general adult medical examination with abnormal findings Assessment & Plan: Patient Counseling(The following topics were reviewed):  Preventative care handout given to pt  Health maintenance and immunizations reviewed. Please refer to Health maintenance section. Pt advised on safe sex, wearing seatbelts in car, and proper nutrition labwork ordered today for annual Dental health: Discussed importance of regular tooth brushing, flossing, and dental visits.    Other orders -     Tdap vaccine greater than or equal to 7yo IM      Follow-up: Return in about 1 month (around 02/28/2023) for f/u blood pressure.   Mort Sawyers, FNP

## 2023-01-28 NOTE — Assessment & Plan Note (Signed)

## 2023-01-28 NOTE — Assessment & Plan Note (Signed)
Start amlodipine 5 mg once daily Pt advised of the following:  Continue medication as prescribed. Monitor blood pressure periodically and/or when you feel symptomatic. Goal is <130/90 on average. Ensure that you have rested for 30 minutes prior to checking your blood pressure. Record your readings and bring them to your next visit if necessary.work on a low sodium diet.  

## 2023-01-30 ENCOUNTER — Encounter: Payer: Self-pay | Admitting: Family

## 2023-01-30 DIAGNOSIS — E7800 Pure hypercholesterolemia, unspecified: Secondary | ICD-10-CM

## 2023-01-31 ENCOUNTER — Encounter: Payer: Self-pay | Admitting: Family

## 2023-01-31 MED ORDER — ATORVASTATIN CALCIUM 10 MG PO TABS: 10.0000 mg | ORAL_TABLET | Freq: Every day | ORAL | 3 refills | Status: DC

## 2023-01-31 NOTE — Telephone Encounter (Signed)
Error

## 2023-01-31 NOTE — Addendum Note (Signed)
Addended by: Mort Sawyers on: 01/31/2023 03:29 PM   Modules accepted: Orders

## 2023-02-03 ENCOUNTER — Encounter: Payer: Self-pay | Admitting: Hematology and Oncology

## 2023-02-03 ENCOUNTER — Encounter: Payer: Self-pay | Admitting: Psychiatry

## 2023-02-03 LAB — LIPOPROTEIN A (LPA): Lipoprotein (a): 35 nmol/L (ref <75)

## 2023-02-28 ENCOUNTER — Encounter: Payer: Self-pay | Admitting: Family

## 2023-02-28 ENCOUNTER — Ambulatory Visit (INDEPENDENT_AMBULATORY_CARE_PROVIDER_SITE_OTHER): Payer: BC Managed Care – PPO | Admitting: Family

## 2023-02-28 VITALS — BP 108/72 | Temp 98.2°F | Ht 67.0 in | Wt 200.7 lb

## 2023-02-28 DIAGNOSIS — R8290 Unspecified abnormal findings in urine: Secondary | ICD-10-CM

## 2023-02-28 DIAGNOSIS — I10 Essential (primary) hypertension: Secondary | ICD-10-CM

## 2023-02-28 DIAGNOSIS — E7800 Pure hypercholesterolemia, unspecified: Secondary | ICD-10-CM | POA: Diagnosis not present

## 2023-02-28 LAB — POC URINALSYSI DIPSTICK (AUTOMATED)
Glucose, UA: NEGATIVE
Ketones, UA: NEGATIVE
Leukocytes, UA: NEGATIVE
Nitrite, UA: NEGATIVE
Protein, UA: POSITIVE — AB
Spec Grav, UA: 1.02 (ref 1.010–1.025)
Urobilinogen, UA: 0.2 U/dL
pH, UA: 6 (ref 5.0–8.0)

## 2023-02-28 LAB — MICROALBUMIN / CREATININE URINE RATIO
Creatinine,U: 244 mg/dL
Microalb Creat Ratio: 0.6 mg/g (ref 0.0–30.0)
Microalb, Ur: 1.5 mg/dL (ref 0.0–1.9)

## 2023-02-28 NOTE — Addendum Note (Signed)
 Addended by: Jaynee Eagles C on: 02/28/2023 08:28 AM   Modules accepted: Orders

## 2023-02-28 NOTE — Addendum Note (Signed)
 Addended by: Jaynee Eagles C on: 02/28/2023 08:26 AM   Modules accepted: Orders

## 2023-02-28 NOTE — Assessment & Plan Note (Signed)
 Improved.  Continue amlodipine  5 mg once daily. Pt advised of the following:  Continue medication as prescribed. Monitor blood pressure periodically and/or when you feel symptomatic. Goal is <130/90 on average. Ensure that you have rested for 30 minutes prior to checking your blood pressure. Record your readings and bring them to your next visit if necessary.work on a low sodium diet.

## 2023-02-28 NOTE — Progress Notes (Signed)
 Established Patient Office Visit  Subjective:   Patient ID: Rebecca Castillo, female    DOB: January 17, 1976  Age: 48 y.o. MRN: [id number]  CC:  Chief Complaint  Patient presents with   Hypertension    HPI: Rebecca Castillo is a 48 y.o. female presenting on 02/28/2023 for Hypertension  HTN: at home average 120 over 80 or so. Doing well. Today in office 108/72. Was having headaches but since taking medication and also not feeling as tired anymore. Taking amlodipine  5 mg once daily. No swelling in ankles.   Snoring, received sleep study stuff however pending registration to get sent to her house.   Hyperlipidemia: tolerating well, started atorvastatin . Being more aware of her diet as far as working on low cholesterol. Has stopped eating out multiple times a day.   Curious about urinary odor. She states will sometimes smell stronger than normal like 'corn chips'. She denies any other urinary symptoms.        ROS: Negative unless specifically indicated above in HPI.   Relevant past medical history reviewed and updated as indicated.   Allergies and medications reviewed and updated.   Current Outpatient Medications:    amLODipine  (NORVASC ) 5 MG tablet, Take 1 tablet (5 mg total) by mouth daily., Disp: 90 tablet, Rfl: 0   atorvastatin  (LIPITOR) 10 MG tablet, Take 1 tablet (10 mg total) by mouth daily., Disp: 90 tablet, Rfl: 3  Allergies  Allergen Reactions   Oxycodone  Other (See Comments)    DIZZINESS    Objective:   BP 108/72 (BP Location: Left Arm, Patient Position: Sitting, Cuff Size: Normal)   Temp 98.2 F (36.8 C) (Temporal)   Ht 5' 7 (1.702 m)   Wt 200 lb 11.2 oz (91 kg)   LMP 10/28/2016   BMI 31.43 kg/m    Physical Exam Constitutional:      General: She is not in acute distress.    Appearance: Normal appearance. She is normal weight. She is not ill-appearing, toxic-appearing or diaphoretic.  HENT:     Head: Normocephalic.  Cardiovascular:     Rate and Rhythm:  Normal rate and regular rhythm.  Pulmonary:     Effort: Pulmonary effort is normal.  Musculoskeletal:        General: Normal range of motion.  Neurological:     General: No focal deficit present.     Mental Status: She is alert and oriented to person, place, and time. Mental status is at baseline.  Psychiatric:        Mood and Affect: Mood normal.        Behavior: Behavior normal.        Thought Content: Thought content normal.        Judgment: Judgment normal.     Assessment & Plan:  Primary hypertension Assessment & Plan: Improved.  Continue amlodipine  5 mg once daily. Pt advised of the following:  Continue medication as prescribed. Monitor blood pressure periodically and/or when you feel symptomatic. Goal is <130/90 on average. Ensure that you have rested for 30 minutes prior to checking your blood pressure. Record your readings and bring them to your next visit if necessary.work on a low sodium diet.   Orders: -     Microalbumin / creatinine urine ratio  Abnormal urine odor Assessment & Plan: Will check poc urine today  Pending results however suspect not enough water Discussed goal of 90 oz daily of water and see if symptoms reside    Orders: -  POCT Urinalysis Dipstick (Automated); Future  Elevated LDL cholesterol level Assessment & Plan: Continue atorvastatin  10 mg nightly  Ordered lipid panel, pending results. Work on low cholesterol diet and exercise as tolerated       Follow up plan: Return in about 6 months (around 08/28/2023) for f/u blood pressure, f/u cholesterol.  Ginger Patrick, FNP

## 2023-02-28 NOTE — Assessment & Plan Note (Signed)
 Will check poc urine today  Pending results however suspect not enough water Discussed goal of 90 oz daily of water and see if symptoms reside

## 2023-02-28 NOTE — Assessment & Plan Note (Signed)
Continue atorvastatin 10 mg nightly  Ordered lipid panel, pending results. Work on low cholesterol diet and exercise as tolerated

## 2023-03-03 ENCOUNTER — Other Ambulatory Visit: Payer: Self-pay | Admitting: Family

## 2023-03-03 ENCOUNTER — Encounter: Payer: Self-pay | Admitting: Family

## 2023-03-03 DIAGNOSIS — N390 Urinary tract infection, site not specified: Secondary | ICD-10-CM

## 2023-03-03 LAB — URINE CULTURE
MICRO NUMBER:: 15943344
SPECIMEN QUALITY:: ADEQUATE

## 2023-03-03 MED ORDER — SULFAMETHOXAZOLE-TRIMETHOPRIM 800-160 MG PO TABS: 1.0000 | ORAL_TABLET | Freq: Two times a day (BID) | ORAL | 0 refills | Status: AC

## 2023-04-25 ENCOUNTER — Other Ambulatory Visit: Payer: Self-pay | Admitting: Family

## 2023-04-25 DIAGNOSIS — I10 Essential (primary) hypertension: Secondary | ICD-10-CM

## 2023-05-20 ENCOUNTER — Other Ambulatory Visit: Payer: Self-pay | Admitting: Family

## 2023-05-20 DIAGNOSIS — E7800 Pure hypercholesterolemia, unspecified: Secondary | ICD-10-CM

## 2023-05-29 ENCOUNTER — Ambulatory Visit: Payer: BC Managed Care – PPO | Admitting: Family

## 2023-05-29 ENCOUNTER — Other Ambulatory Visit (INDEPENDENT_AMBULATORY_CARE_PROVIDER_SITE_OTHER): Payer: BC Managed Care – PPO

## 2023-05-29 DIAGNOSIS — E7800 Pure hypercholesterolemia, unspecified: Secondary | ICD-10-CM

## 2023-05-29 LAB — LIPID PANEL
Cholesterol: 139 mg/dL (ref 0–200)
HDL: 34.4 mg/dL — ABNORMAL LOW (ref >39.00)
LDL Cholesterol: 84 mg/dL (ref 0–99)
NonHDL: 104.76
Total CHOL/HDL Ratio: 4
Triglycerides: 105 mg/dL (ref 0.0–149.0)
VLDL: 21 mg/dL (ref 0.0–40.0)

## 2023-05-30 ENCOUNTER — Encounter: Payer: Self-pay | Admitting: Family

## 2023-08-28 ENCOUNTER — Ambulatory Visit: Payer: BC Managed Care – PPO | Admitting: Family

## 2023-10-13 DIAGNOSIS — Z1231 Encounter for screening mammogram for malignant neoplasm of breast: Secondary | ICD-10-CM | POA: Diagnosis not present

## 2023-10-13 DIAGNOSIS — Z01419 Encounter for gynecological examination (general) (routine) without abnormal findings: Secondary | ICD-10-CM | POA: Diagnosis not present

## 2023-10-13 DIAGNOSIS — N951 Menopausal and female climacteric states: Secondary | ICD-10-CM | POA: Diagnosis not present

## 2023-10-13 DIAGNOSIS — Z1321 Encounter for screening for nutritional disorder: Secondary | ICD-10-CM | POA: Diagnosis not present

## 2023-10-13 DIAGNOSIS — Z6831 Body mass index (BMI) 31.0-31.9, adult: Secondary | ICD-10-CM | POA: Diagnosis not present

## 2023-12-16 ENCOUNTER — Encounter (HOSPITAL_COMMUNITY): Payer: Self-pay

## 2023-12-16 ENCOUNTER — Ambulatory Visit: Payer: Self-pay

## 2023-12-16 ENCOUNTER — Ambulatory Visit (INDEPENDENT_AMBULATORY_CARE_PROVIDER_SITE_OTHER)

## 2023-12-16 ENCOUNTER — Ambulatory Visit (HOSPITAL_COMMUNITY)
Admission: RE | Admit: 2023-12-16 | Discharge: 2023-12-16 | Disposition: A | Discharge status: Home / Self Care | Source: Ambulatory Visit

## 2023-12-16 VITALS — BP 157/97 | HR 103 | Temp 98.2°F | Resp 20

## 2023-12-16 DIAGNOSIS — J069 Acute upper respiratory infection, unspecified: Secondary | ICD-10-CM | POA: Diagnosis not present

## 2023-12-16 DIAGNOSIS — R051 Acute cough: Secondary | ICD-10-CM

## 2023-12-16 DIAGNOSIS — R059 Cough, unspecified: Secondary | ICD-10-CM | POA: Diagnosis not present

## 2023-12-16 LAB — POC COVID19/FLU A&B COMBO
Covid Antigen, POC: NEGATIVE
Influenza A Antigen, POC: NEGATIVE
Influenza B Antigen, POC: NEGATIVE

## 2023-12-16 NOTE — Telephone Encounter (Signed)
 FYI Only or Action Required?: FYI only for provider.  Patient was last seen in primary care on 02/28/2023 by Corwin Antu, FNP.  Called Nurse Triage reporting Cough.  Symptoms began yesterday.  Interventions attempted: OTC medications: Tylenol  and migraine medicine.  Symptoms are: gradually worsening.  Triage Disposition: See Physician Within 24 Hours  Patient/caregiver understands and will follow disposition?: Yes      Copied from CRM [id number]. Topic: Clinical - Red Word Triage >> Dec 16, 2023  8:15 AM Rosina BIRCH wrote: Reason for RMF:rnlhy, cant keep nothing down headache and congestion Reason for Disposition  SEVERE coughing spells (e.g., whooping sound after coughing, vomiting after coughing)  Answer Assessment - Initial Assessment Questions 1. ONSET: When did the cough begin?      Yesterday  2. SEVERITY: How bad is the cough today?      Moderate  3. SPUTUM: Describe the color of your sputum (e.g., none, dry cough; clear, white, yellow, green)     Tan  4. HEMOPTYSIS: Are you coughing up any blood? If Yes, ask: How much? (e.g., flecks, streaks, tablespoons, etc.)     Denies  5. DIFFICULTY BREATHING: Are you having difficulty breathing? If Yes, ask: How bad is it? (e.g., mild, moderate, severe)      Denies  6. FEVER: Do you have a fever? If Yes, ask: What is your temperature, how was it measured, and when did it start?     Yes, had a fever last night now resolved  7. CARDIAC HISTORY: Do you have any history of heart disease? (e.g., heart attack, congestive heart failure)      Denies  8. LUNG HISTORY: Do you have any history of lung disease?  (e.g., pulmonary embolus, asthma, emphysema)     Denies  9. OTHER SYMPTOMS: Do you have any other symptoms? (e.g., runny nose, wheezing, chest pain)       Headache, congestion, vomiting, chills   Took Tylenol  and migraine medication for symptoms. Patient made an appointment for today at [facility] at [facility].  Protocols used: Cough - Acute Productive-A-AH

## 2023-12-16 NOTE — Discharge Instructions (Signed)

## 2023-12-16 NOTE — ED Provider Notes (Signed)
 [facility]    CSN: [id number] Arrival date & time: 12/16/23  1439      History   Chief Complaint Chief Complaint  Patient presents with   Cough    Entered by patient   Sore Throat   Nasal Congestion   Headache   Fever    HPI Rebecca Castillo is a 48 y.o. female.   Patient is in today due to cough productive of yellow sputum, fever (highest temp 101), headache, throat pain, reduced appetite,  nausea, and 2 episodes of vomiting yesterday.  Patient states all her symptoms started Sunday.  Patient states that she took Tylenol yesterday to control fever but denies taking anything new today.  Patient does report that she has been around sick grandchildren.  The history is provided by the patient.  Cough Associated symptoms: fever and headaches   Sore Throat Associated symptoms include headaches.  Headache Associated symptoms: cough and fever   Fever Associated symptoms: cough and headaches     Past Medical History:  Diagnosis Date   Family history of early CAD 07/01/2011   Hypertension    Iron deficiency anemia    blood loss, had hysterectomy 2019, no anemia since   PONV (postoperative nausea and vomiting)    Umbilical hernia 05/2013    Patient Active Problem List   Diagnosis Date Noted   Abnormal urine odor 02/28/2023   Obesity (BMI 30-39.9) 01/28/2023   Snoring 01/28/2023   Vitamin B12 deficiency 01/28/2023   Primary hypertension 01/28/2023   Elevated LDL cholesterol level 01/28/2023   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   Family history of heart disease in female family member before age 65 09/18/2021   RLS (restless legs syndrome) 01/20/2017   Iron deficiency anemia 03/19/2010    Past Surgical History:  Procedure Laterality Date   CESAREAN SECTION     x 4   MULTIPLE TOOTH EXTRACTIONS     abscesses , wisdom teeth as well   partial vaginal hysterectomy  2019   ROBOTIC ASSISTED SALPINGO OOPHERECTOMY N/A 01/14/2023   Procedure:  XI ROBOTIC ASSISTED RIGHT SALPINGO OOPHORECTOMY AND LYSIS OF ADHESIONS;  Surgeon: Newton, Meredith, MD;  Location: [facility];  Service: Gynecology;  Laterality: N/A;   UMBILICAL HERNIA REPAIR  2019    OB History     Gravida  4   Para  4   Term  4   Preterm      AB      Living  4      SAB      IAB      Ectopic      Multiple      Live Births  4            Home Medications    Prior to Admission medications   Medication Sig Start Date End Date Taking? Authorizing Provider  amLODipine (NORVASC) 5 MG tablet TAKE 1 TABLET (5 MG TOTAL) BY MOUTH DAILY. 04/25/23  Yes Dugal, Tabitha, FNP  atorvastatin (LIPITOR) 10 MG tablet Take 1 tablet (10 mg total) by mouth daily. 01/31/23  Yes Dugal, Tabitha, FNP    Family History Family History  Problem Relation Age of Onset   Kidney disease Mother    Hypertension Mother    Hyperlipidemia Mother    Heart disease Mother    Heart attack Mother        60    Stroke Mother    Healthy Father        murder  Lung cancer Maternal Grandmother    Throat cancer Maternal Grandmother    Breast cancer Neg Hx    Prostate cancer Neg Hx    Pancreatic cancer Neg Hx    Ovarian cancer Neg Hx    Endometrial cancer Neg Hx    Colon cancer Neg Hx     Social History Social History   Tobacco Use   Smoking status: Never   Smokeless tobacco: Never  Vaping Use   Vaping status: Never Used  Substance Use Topics   Alcohol use: Yes    Comment: occasionally   Drug use: No     Allergies   Oxycodone    Review of Systems Review of Systems  Constitutional:  Positive for fever.  Respiratory:  Positive for cough.   Neurological:  Positive for headaches.     Physical Exam Triage Vital Signs ED Triage Vitals  Encounter Vitals Group     BP 12/16/23 1450 (!) 157/97     Girls Systolic BP Percentile --      Girls Diastolic BP Percentile --      Boys Systolic BP Percentile --      Boys Diastolic BP Percentile --      Pulse Rate 12/16/23 1450  (!) 103     Resp 12/16/23 1450 20     Temp 12/16/23 1450 98.2 F (36.8 C)     Temp Source 12/16/23 1450 Oral     SpO2 12/16/23 1450 94 %     Weight --      Height --      Head Circumference --      Peak Flow --      Pain Score 12/16/23 1448 0     Pain Loc --      Pain Education --      Exclude from Growth Chart --    No data found.  Updated Vital Signs BP (!) 157/97 (BP Location: Right Arm)   Pulse (!) 103   Temp 98.2 F (36.8 C) (Oral)   Resp 20   LMP 10/28/2016   SpO2 94%   Visual Acuity Right Eye Distance:   Left Eye Distance:   Bilateral Distance:    Right Eye Near:   Left Eye Near:    Bilateral Near:     Physical Exam Vitals and nursing note reviewed.  Constitutional:      General: She is not in acute distress.    Appearance: Normal appearance. She is not ill-appearing, toxic-appearing or diaphoretic.  Eyes:     General: No scleral icterus. Cardiovascular:     Rate and Rhythm: Normal rate and regular rhythm.     Heart sounds: Normal heart sounds.  Pulmonary:     Effort: Pulmonary effort is normal. No respiratory distress.     Breath sounds: Normal breath sounds. No wheezing or rhonchi.  Skin:    General: Skin is warm.  Neurological:     Mental Status: She is alert and oriented to person, place, and time.  Psychiatric:        Mood and Affect: Mood normal.        Behavior: Behavior normal.      UC Treatments / Results  Labs (all labs ordered are listed, but only abnormal results are displayed) Labs Reviewed  POC COVID19/FLU A&B COMBO    EKG   Radiology DG Chest 2 View Result Date: 12/16/2023 CLINICAL DATA:  Cough. EXAM: CHEST - 2 VIEW COMPARISON:  Chest radiograph dated 01/28/2012. FINDINGS: The heart size and  mediastinal contours are within normal limits. Both lungs are clear. The visualized skeletal structures are unremarkable. IMPRESSION: No active cardiopulmonary disease. Electronically Signed   By: Vanetta Chou M.D.   On: 12/16/2023  15:34    Procedures Procedures (including critical care time)  Medications Ordered in UC Medications - No data to display  Initial Impression / Assessment and Plan / UC Course  I have reviewed the triage vital signs and the nursing notes.  Pertinent labs & imaging results that were available during my care of the patient were reviewed by me and considered in my medical decision making (see chart for details).     Chest x-ray was negative, patient was negative for COVID and flu as well.  Symptoms likely due to viral illness.  Patient was given instructions for supportive treatment. Final Clinical Impressions(s) / UC Diagnoses   Final diagnoses:  Acute cough  Viral URI with cough     Discharge Instructions      You been diagnosed with a viral illness today. -Viruses have to run their course and medicines that are prescribed are meant to help with symptoms. - With viruses usually feel poorly from 3 to 7 days with cough being the last symptoms to resolve.  -Cough can linger from days to weeks.  Antibiotics are not effective for viruses. -If your cough lasts more than 2 weeks and you are coughing so hard that you are vomiting or feel like you could pass out we need to follow-up with PCP for further testing and evaluation. -Rest, increase water intake, may use pseudoephedrine for nasal congestion, Delsym (dextromethorphan) or honey as needed for cough, and ibuprofen  and/or Tylenol  as directed on packaging for pain and fever. -If you have hypertension you should take Coricidin or other OTC meds approved for people with high blood pressure. -You may use a spoonful of honey every 4-6 hours as needed for throat pain and cough. -Warm tea with honey and lemon are helpful for soothe throat as well.  Chloraseptic and Cepacol make a throat lozenge with numbing medication, can be purchased over-the-counter. -May also use Flonase or sinus rinse for sinus pressure or nasal congestion.  Be sure to  use distilled bottled water for sinus rinses. -May use coolmist humidifier to open up nasal passages -May elevate head to assist with postnasal drainage. -If you feel poorly (fever, fatigue, shortness of breath, nausea, etc.) for more than 10 days to be sure to follow-up with PCP or in clinic for further evaluation and additional treatments. If you experience chest pain with shortness of breath or pulse oxygen less than 95% you should report to the ER.      ED Prescriptions   None    PDMP not reviewed this encounter.   Andra Corean BROCKS, PA-C 12/16/23 1611

## 2023-12-16 NOTE — ED Triage Notes (Signed)
 Pt states she started with headache, fever, cough ,congestion sore throat on Sunday night. She has been taking tylenol  as needed.

## 2024-02-23 DIAGNOSIS — E7800 Pure hypercholesterolemia, unspecified: Secondary | ICD-10-CM
